# Patient Record
Sex: Female | Born: 2000 | Race: White | Hispanic: No | Marital: Single | Smoking: Never smoker
Health system: Southern US, Community
[De-identification: ages and names within clinical notes are randomized; demographics above are authoritative.]

## PROBLEM LIST (undated history)

## (undated) DIAGNOSIS — R519 Headache, unspecified: Secondary | ICD-10-CM

## (undated) DIAGNOSIS — H6690 Otitis media, unspecified, unspecified ear: Secondary | ICD-10-CM

## (undated) DIAGNOSIS — M419 Scoliosis, unspecified: Secondary | ICD-10-CM

## (undated) DIAGNOSIS — H532 Diplopia: Secondary | ICD-10-CM

## (undated) DIAGNOSIS — H9319 Tinnitus, unspecified ear: Secondary | ICD-10-CM

## (undated) HISTORY — DX: Tinnitus, unspecified ear: H93.19

## (undated) HISTORY — DX: Scoliosis, unspecified: M41.9

## (undated) HISTORY — DX: Diplopia: H53.2

## (undated) HISTORY — DX: Headache, unspecified: R51.9

## (undated) HISTORY — DX: Otitis media, unspecified, unspecified ear: H66.90

## (undated) HISTORY — PX: OTHER SURGICAL HISTORY: SHX169

---

## 2019-09-05 ENCOUNTER — Other Ambulatory Visit: Payer: Self-pay

## 2019-09-05 ENCOUNTER — Ambulatory Visit (INDEPENDENT_AMBULATORY_CARE_PROVIDER_SITE_OTHER): Payer: Medicaid Other | Admitting: Otolaryngology

## 2019-09-05 DIAGNOSIS — H66012 Acute suppurative otitis media with spontaneous rupture of ear drum, left ear: Secondary | ICD-10-CM | POA: Diagnosis not present

## 2019-09-19 ENCOUNTER — Other Ambulatory Visit: Payer: Self-pay

## 2019-09-19 ENCOUNTER — Ambulatory Visit (INDEPENDENT_AMBULATORY_CARE_PROVIDER_SITE_OTHER): Payer: Medicaid Other | Admitting: Otolaryngology

## 2019-11-10 ENCOUNTER — Other Ambulatory Visit (HOSPITAL_COMMUNITY): Payer: Self-pay | Admitting: Otolaryngology

## 2019-11-10 ENCOUNTER — Other Ambulatory Visit: Payer: Self-pay | Admitting: Otolaryngology

## 2019-11-10 DIAGNOSIS — H9012 Conductive hearing loss, unilateral, left ear, with unrestricted hearing on the contralateral side: Secondary | ICD-10-CM

## 2019-11-25 ENCOUNTER — Other Ambulatory Visit: Payer: Self-pay

## 2019-11-25 ENCOUNTER — Ambulatory Visit (HOSPITAL_COMMUNITY)
Admission: RE | Admit: 2019-11-25 | Discharge: 2019-11-25 | Disposition: A | Discharge status: Home / Self Care | Payer: Medicaid Other | Source: Ambulatory Visit | Attending: Otolaryngology | Admitting: Otolaryngology

## 2019-11-25 DIAGNOSIS — H9012 Conductive hearing loss, unilateral, left ear, with unrestricted hearing on the contralateral side: Secondary | ICD-10-CM | POA: Diagnosis not present

## 2020-10-16 ENCOUNTER — Ambulatory Visit (INDEPENDENT_AMBULATORY_CARE_PROVIDER_SITE_OTHER): Payer: Medicaid Other | Admitting: Neurology

## 2020-10-16 ENCOUNTER — Telehealth: Payer: Self-pay | Admitting: Neurology

## 2020-10-16 ENCOUNTER — Encounter: Payer: Self-pay | Admitting: Neurology

## 2020-10-16 ENCOUNTER — Ambulatory Visit: Payer: Self-pay

## 2020-10-16 VITALS — BP 133/93 | HR 87 | Ht 64.0 in | Wt 137.5 lb

## 2020-10-16 DIAGNOSIS — H532 Diplopia: Secondary | ICD-10-CM

## 2020-10-16 DIAGNOSIS — G932 Benign intracranial hypertension: Secondary | ICD-10-CM

## 2020-10-16 MED ORDER — GADOBENATE DIMEGLUMINE 529 MG/ML IV SOLN
12.0000 mL | Freq: Once | INTRAVENOUS | Status: AC | PRN
  Administered 2020-10-16: 18:00:00 12 mL via INTRAVENOUS

## 2020-10-16 NOTE — Progress Notes (Addendum)
Chief Complaint  Patient presents with  . New Patient (Initial Visit)    Referred for sudden onset of horizontal diplopia on 10/05/20. Prior to that to date, she was having daily headaches for two weeks which is not typical for her. Tinnitus present for two months. States she was just seen by ENT who is going to monitor the symptoms. Her vision has not improved. Headaches have resolved. She also mentions having a chronic ear infection that took 6+ months to clear.    HISTORICAL  Deanna Romero is a 19 year old female, seen in request by her optometrist Dr. Conley Rolls, [place] for evaluation of bilateral papillary edema, low vision, she is accompanied by her mother at today's clinical visit on October 16, 2020.  Her primary care physician is Dr. Neita Carp, Clarene Critchley, MD   I reviewed and summarized the referring note.PMhx. Acne, taking minocycline  Birth control, was switched to current [place] in August 2021, Chronic migraine headaches  She had long history of chronic migraine headaches, she describes her migraines as moderate to severe pain at bilateral frontal, vertex region pressure headaches, with light noise sensitivity, no nausea, usually last few hours, Maxalt as needed works well for her, she has been wearing glasses, was followed up by Dr.Le, [place] for few years  On October 05, 2020, she got different kind of headaches, holoacranial, constant pressure, failed to respond to Maxalt treatment, and multiple over-the-counter medications, Tylenol, ibuprofen, Excedrin Migraine and Fioricet, the headache last about a week  On October 12, 2020, the day of her graduation, she woke up noticed double vision looking to the left and right, horizontal, mild double vision looking for distance straight ahead, no double vision when looking close up, she described double vision, blurry when walking across the stage  Since then, she noticed intermittent double vision, no significant change over the past few days,  she denies droopy eyelid, no dysarthria, no dysphagia, no limb muscle weakness, she no longer has headache now  She was seen by Dr. 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  ALLERGIES: Allergies  Allergen Reactions  . Sulfamethoxazole Hives    HOME MEDICATIONS: Current Outpatient Medications  Medication Sig Dispense Refill  . aluminum chloride (DRYSOL) 20 % external solution Apply topically at bedtime.    Marland Kitchen aspirin-acetaminophen-caffeine (EXCEDRIN MIGRAINE) 250-250-65 MG tablet Take 1 tablet by mouth every 6 (six) hours as needed for headache. Takes once per week.    . butalbital-acetaminophen-caffeine (FIORICET) 50-325-40 MG tablet Take 1 tablet by mouth as needed for headache.    . clindamycin (CLEOCIN T) 1 % external solution Apply topically daily.    Marland Kitchen ibuprofen (ADVIL) 200 MG tablet Take 200 mg by mouth every 6 (six) hours as needed. Takes once per week.    . minocycline (MINOCIN) 100 MG capsule Take 1 capsule by mouth every other day.    . norethindrone (MICRONOR) 0.35 MG tablet Take 1 tablet by mouth daily.    . Rizatriptan Benzoate (MAXALT PO) Take by mouth as needed. Unsure of dosage.     No current facility-administered medications for this visit.    PAST MEDICAL HISTORY: Past Medical History:  Diagnosis Date  . Chronic ear infection   . Diplopia   .  Headache   . Scoliosis   . Tinnitus     PAST SURGICAL HISTORY: Past Surgical History:  Procedure Laterality Date  . No past surgery      FAMILY HISTORY: Family History  Problem Relation Age of Onset  . Migraines Mother   . Migraines Father     SOCIAL HISTORY: Social History   Socioeconomic History  . Marital status: Single     Spouse name: Not on file  . Number of children: 0  . Years of education: in college  . Highest education level: Not on file  Occupational History  . Occupation: Consulting civil engineer  Tobacco Use  . Smoking status: Never Smoker  . Smokeless tobacco: Never Used  Substance and Sexual Activity  . Alcohol use: Not Currently  . Drug use: Never  . Sexual activity: Not on file  Other Topics Concern  . Not on file  Social History Narrative   Lives at home.   Right-handed.   No daily use of caffeine.   Social Determinants of Health   Financial Resource Strain: Not on file  Food Insecurity: Not on file  Transportation Needs: Not on file  Physical Activity: Not on file  Stress: Not on file  Social Connections: Not on file  Intimate Partner Violence: Not on file     PHYSICAL EXAM   Vitals:   10/16/20 1533  BP: (!) 133/93  Pulse: 87  Weight: 137 lb 8 oz (62.4 kg)  Height: 5\' 4"  (1.626 m)   Not recorded     Body mass index is 23.6 kg/m.  PHYSICAL EXAMNIATION:  Gen: NAD, conversant, well nourised, well groomed                     Cardiovascular: Regular rate rhythm, no peripheral edema, warm, nontender. Eyes: Conjunctivae clear without exudates or hemorrhage Neck: Supple, no carotid bruits. Pulmonary: Clear to auscultation bilaterally   NEUROLOGICAL EXAM:  MENTAL STATUS: Speech:    Speech is normal; fluent and spontaneous with normal comprehension.  Cognition:     Orientation to time, place and person     Normal recent and remote memory     Normal Attention span and concentration     Normal Language, naming, repeating,spontaneous speech     Fund of knowledge   CRANIAL NERVES: CN II: Visual fields are full to confrontation. Pupils are round equal and briskly reactive to light. CN III, IV, VI: No ptosis, esotropia, Red lens testing confirmed bilateral lateral rectus muscle weakness CN V: Facial sensation is intact to light touch CN VII: Face is symmetric with normal eye  closure  CN VIII: Hearing is normal to causal conversation. CN IX, X: Phonation is normal. CN XI: Head turning and shoulder shrug are intact  MOTOR: There is no pronator drift of out-stretched arms. Muscle bulk and tone are normal. Muscle strength is normal.  REFLEXES: Reflexes are 2+ and symmetric at the biceps, triceps, knees, and ankles. Plantar responses are flexor.  SENSORY: Intact to light touch, pinprick and vibratory sensation are intact in fingers and toes.  COORDINATION: There is no trunk or limb dysmetria noted.  GAIT/STANCE: Posture is normal. Gait is steady with normal steps, base, arm swing, and turning. Heel and toe walking are normal. Tandem gait is normal.  Romberg is absent.   DIAGNOSTIC DATA (LABS, IMAGING, TESTING) - I reviewed patient records, labs, notes, testing and imaging myself where available.   ASSESSMENT AND PLAN  Deanna Romero is a 19 y.o. 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 Ph.D.  [facility] [address] [place], [place] [address] Ph: [phone] Fax: [phone]  CC:  Conley Rolls [place], [place] [address] [place],  [place] [address]  Estanislado Pandy, MD

## 2020-10-16 NOTE — Telephone Encounter (Signed)
Per Dr. Terrace Arabia, pt needs to be seen back in 2 weeks. She does not have any openings until February. Advised pt we would call her when we are able to work her in somewhere.

## 2020-10-16 NOTE — Telephone Encounter (Signed)
Per vo by Dr. Terrace Arabia, she will review her MRI brain then make a decision on when her appt should be scheduled. We can work her in where needed.

## 2020-10-17 ENCOUNTER — Telehealth: Payer: Self-pay | Admitting: Neurology

## 2020-10-17 DIAGNOSIS — G932 Benign intracranial hypertension: Secondary | ICD-10-CM | POA: Insufficient documentation

## 2020-10-17 LAB — CBC WITH DIFFERENTIAL
Basophils Absolute: 0.1 10*3/uL (ref 0.0–0.2)
Basos: 1 %
EOS (ABSOLUTE): 0.1 10*3/uL (ref 0.0–0.4)
Eos: 2 %
Hematocrit: 44.2 % (ref 34.0–46.6)
Hemoglobin: 15 g/dL (ref 11.1–15.9)
Immature Grans (Abs): 0 10*3/uL (ref 0.0–0.1)
Immature Granulocytes: 0 %
Lymphocytes Absolute: 2.7 10*3/uL (ref 0.7–3.1)
Lymphs: 30 %
MCH: 30 pg (ref 26.6–33.0)
MCHC: 33.9 g/dL (ref 31.5–35.7)
MCV: 88 fL (ref 79–97)
Monocytes Absolute: 0.6 10*3/uL (ref 0.1–0.9)
Monocytes: 7 %
Neutrophils Absolute: 5.4 10*3/uL (ref 1.4–7.0)
Neutrophils: 60 %
RBC: 5 x10E6/uL (ref 3.77–5.28)
RDW: 12.2 % (ref 11.7–15.4)
WBC: 8.9 10*3/uL (ref 3.4–10.8)

## 2020-10-17 LAB — THYROID PANEL WITH TSH
Free Thyroxine Index: 3.1 (ref 1.2–4.9)
T3 Uptake Ratio: 31 % (ref 24–39)
T4, Total: 9.9 ug/dL (ref 4.5–12.0)
TSH: 2.21 u[IU]/mL (ref 0.450–4.500)

## 2020-10-17 LAB — COMPREHENSIVE METABOLIC PANEL
ALT: 47 IU/L — ABNORMAL HIGH (ref 0–32)
AST: 26 IU/L (ref 0–40)
Albumin/Globulin Ratio: 1.5 (ref 1.2–2.2)
Albumin: 4.7 g/dL (ref 3.9–5.0)
Alkaline Phosphatase: 109 IU/L — ABNORMAL HIGH (ref 42–106)
BUN/Creatinine Ratio: 11 (ref 9–23)
BUN: 11 mg/dL (ref 6–20)
Bilirubin Total: 0.3 mg/dL (ref 0.0–1.2)
CO2: 23 mmol/L (ref 20–29)
Calcium: 10.3 mg/dL — ABNORMAL HIGH (ref 8.7–10.2)
Chloride: 103 mmol/L (ref 96–106)
Creatinine, Ser: 1.01 mg/dL — ABNORMAL HIGH (ref 0.57–1.00)
GFR calc Af Amer: 93 mL/min/{1.73_m2} (ref >59)
GFR calc non Af Amer: 80 mL/min/{1.73_m2} (ref >59)
Globulin, Total: 3.1 g/dL (ref 1.5–4.5)
Glucose: 98 mg/dL (ref 65–99)
Potassium: 4.2 mmol/L (ref 3.5–5.2)
Sodium: 141 mmol/L (ref 134–144)
Total Protein: 7.8 g/dL (ref 6.0–8.5)

## 2020-10-17 LAB — C-REACTIVE PROTEIN: CRP: <1 mg/L (ref 0–10)

## 2020-10-17 LAB — ANA W/REFLEX IF POSITIVE: Anti Nuclear Antibody (ANA): NEGATIVE

## 2020-10-17 LAB — SEDIMENTATION RATE: Sed Rate: 3 mm/hr (ref 0–32)

## 2020-10-17 LAB — HIV ANTIBODY (ROUTINE TESTING W REFLEX): HIV Screen 4th Generation wRfx: NONREACTIVE

## 2020-10-17 LAB — RPR: RPR Ser Ql: NONREACTIVE

## 2020-10-17 NOTE — Telephone Encounter (Signed)
  IMPRESSION: This MRI of the brain with and without contrast shows the following: 1.    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 2.    The brain is otherwise normal. 3.    No acute findings.  I have called patient, MRI brain w/wo only showed incidental findings of right cerebellar cavernous angioma 9 mm, would not explain her bilateral papillary edema, there was no other intracranial space-occupying lesion  1, I will refer her to ophthalmologist for further evaluation including formal visual field testing  2, fluoroscopy guided lumbar puncture  3, MRV of the brain

## 2020-10-17 NOTE — Addendum Note (Signed)
Addended by: Levert Feinstein on: 10/17/2020 08:15 AM   Modules accepted: Orders

## 2020-10-17 NOTE — Telephone Encounter (Signed)
mcd healthy blue pending  

## 2020-10-18 ENCOUNTER — Ambulatory Visit
Admission: RE | Admit: 2020-10-18 | Discharge: 2020-10-18 | Disposition: A | Discharge status: Home / Self Care | Payer: Medicaid Other | Source: Ambulatory Visit | Attending: Neurology | Admitting: Neurology

## 2020-10-18 VITALS — BP 128/87 | HR 70

## 2020-10-18 DIAGNOSIS — G932 Benign intracranial hypertension: Secondary | ICD-10-CM

## 2020-10-18 DIAGNOSIS — H532 Diplopia: Secondary | ICD-10-CM

## 2020-10-18 NOTE — Discharge Instructions (Signed)

## 2020-10-20 IMAGING — CT CT TEMPORAL BONES W/O CM
2 of 4 series · 13 of 40 positions shown, 16 images · non-contrast
Comparison: None.

CLINICAL DATA: Initial evaluation for chronic drainage from left
ear

EXAM:
CT TEMPORAL BONES WITHOUT CONTRAST
TECHNIQUE: Axial and coronal plane CT imaging of the petrous temporal bones was
performed with thin-collimation image reconstruction. No intravenous
contrast was administered. Multiplanar CT image reconstructions were
also generated.

[Series 5: ax mag right · axial · 0.19mm/px · z∈[+48,+118]mm · 10 of 139 slices shown, 13 images]
[im 11/139  brain]
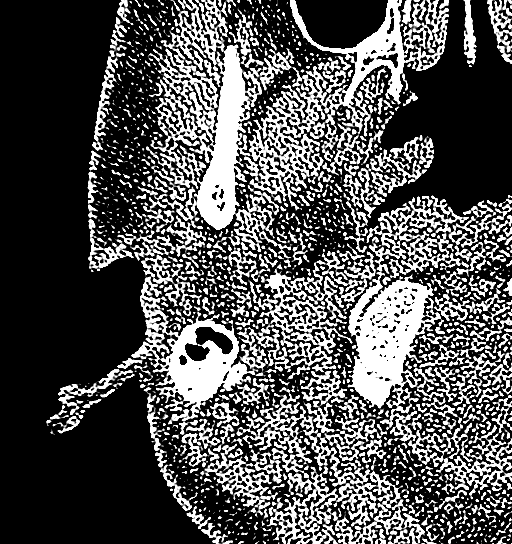
[im 11/139  bone]
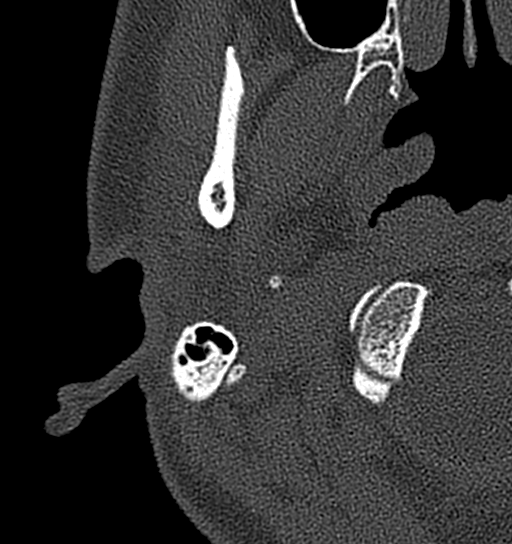
[im 22/139  bone]
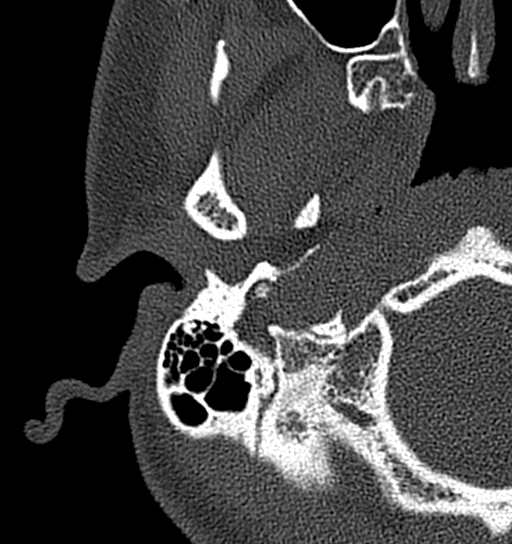
[im 43/139  bone]
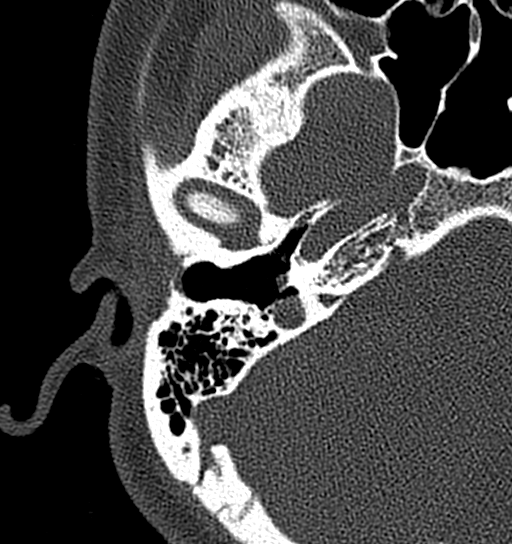
[im 54/139  bone]
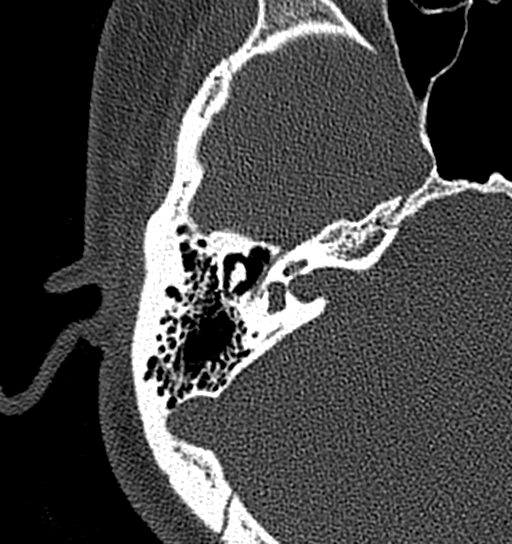
[im 64/139  brain]
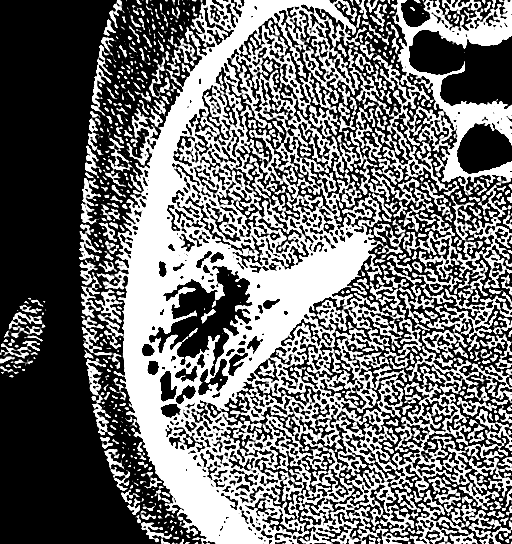
[im 64/139  bone]
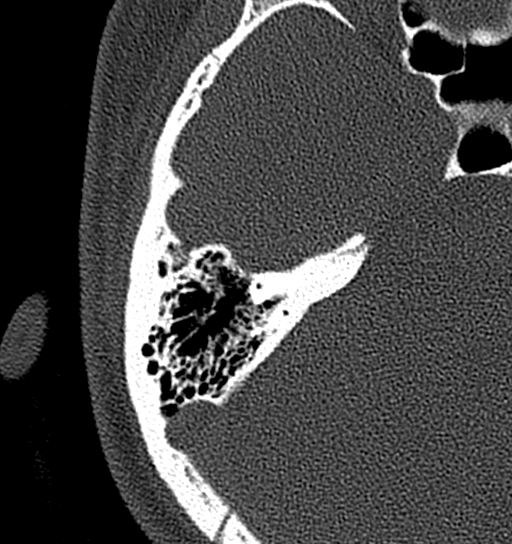
[im 75/139  bone]
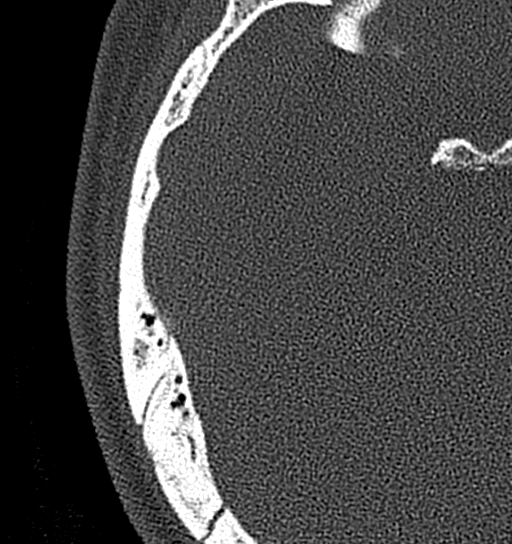
[im 85/139  bone]
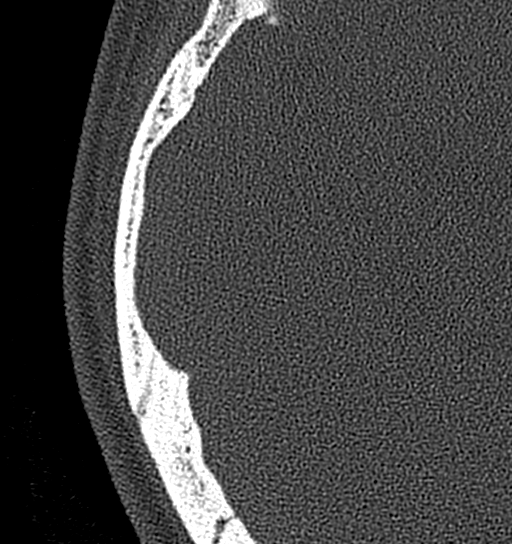
[im 107/139  bone]
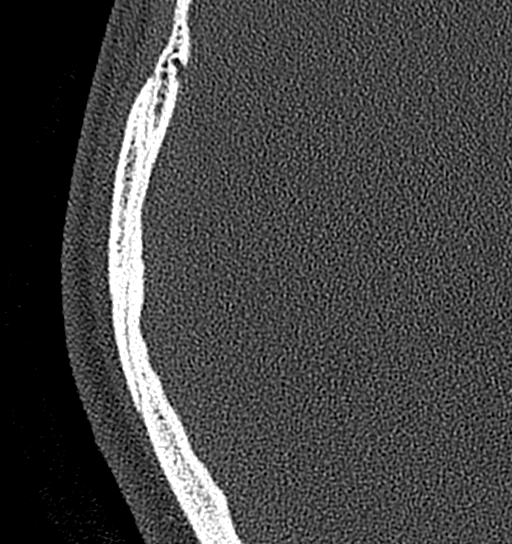
[im 117/139  brain]
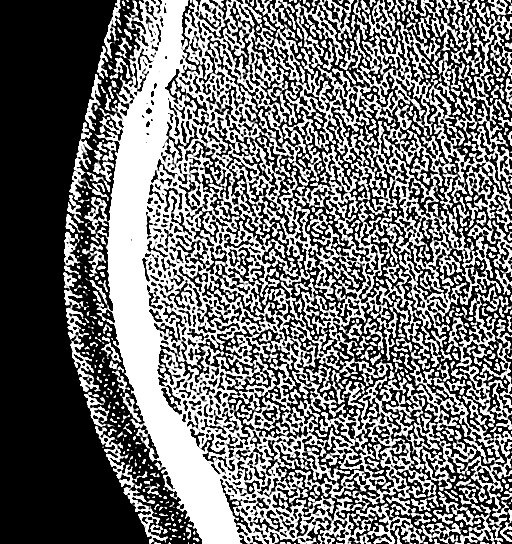
[im 117/139  bone]
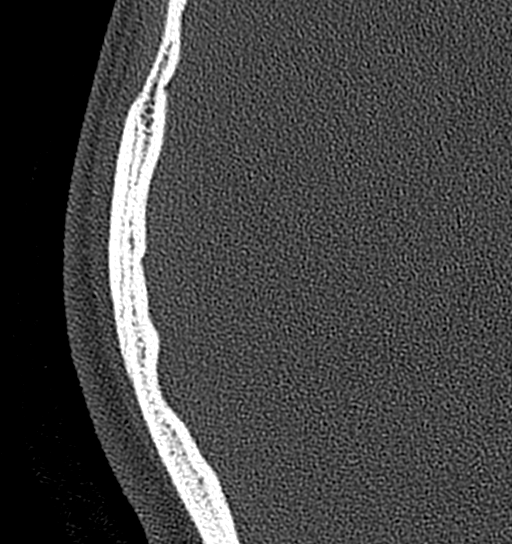
[im 128/139  bone]
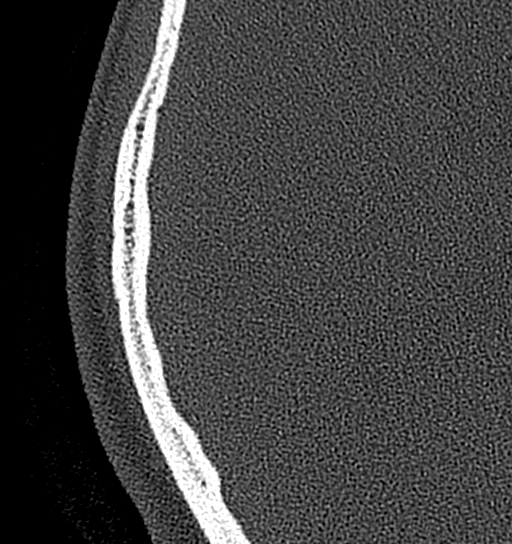

[Series 9: cor soft · coronal · 0.19mm/px · 3 of 95 slices shown]
[im 32/95  bone]
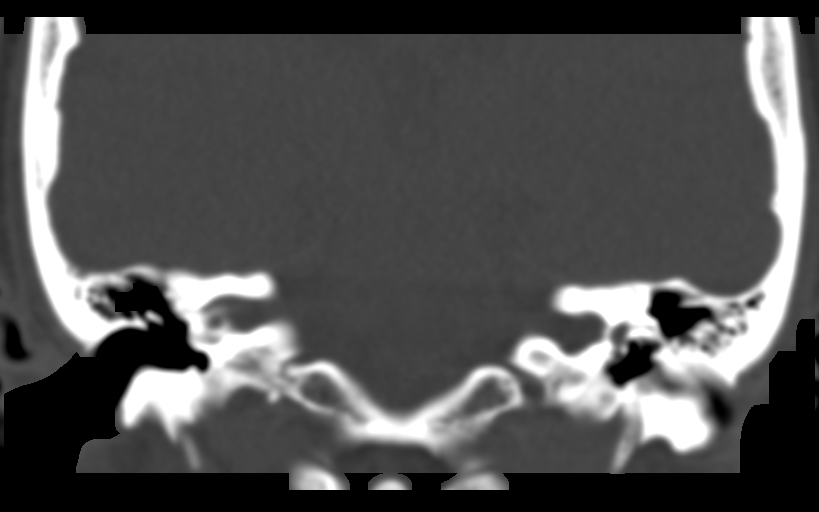
[im 42/95  bone]
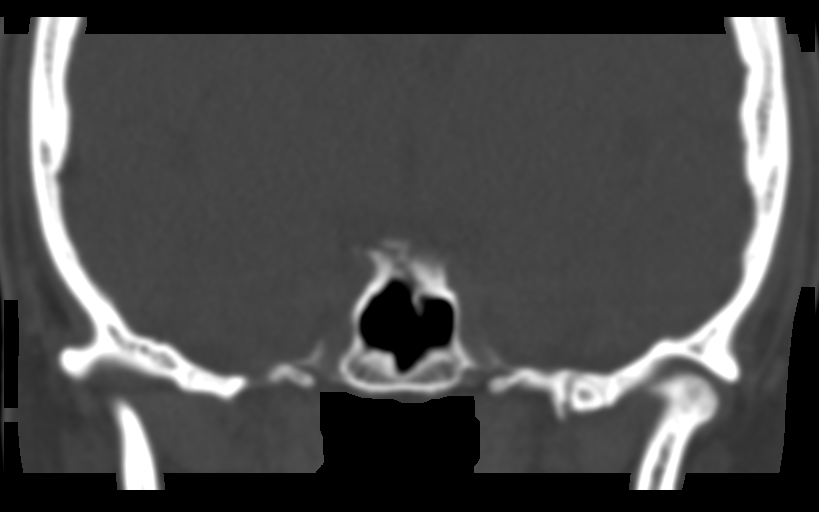
[im 53/95  bone]
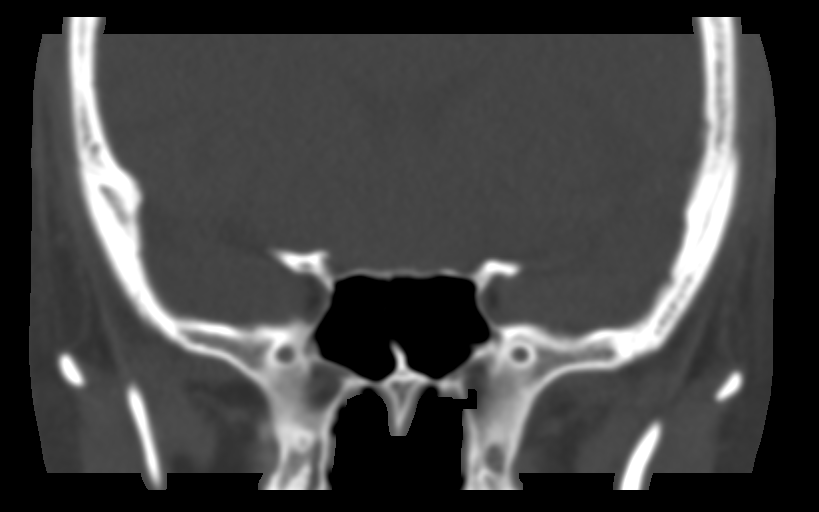

[13 of 40 positions shown; findings below may reference images not displayed]

FINDINGS: Left temporal bone:

Visualized auricle and pinna within normal limits. No pre or
postauricular soft tissue swelling or inflammatory changes. There is
abnormal circumferential soft tissue thickening involving the left
external auditory canal (series 8, image 103). Thinning and mild
erosive changes of the underlying tympanic plate. Tympanic membrane
is thickened and irregular but remains intact. No significant
erosion of the scutum. Middle ear cavity including the epitympanum,
mesotympanum, and hypotympanum are clear. Ossicular chain normally
formed and intact. Tegmen tympani intact. Mastoid air cells are
largely clear. No coalescence. Sigmoid plate intact. Inner ear
structures including the vestibule, cochlea, and semi circular
canals within normal limits. Facial nerve canal remains bony covered
and intact, with the facial nerves seen coursing normally out the
stylomastoid foramen. Internal auditory canal within normal limits.
Normal vestibular aqueduct. Jugular bulb and carotid canal intact
and normal.

Right temporal bone:

Visualized auricle and pinna within normal limits. External auditory
canal is clear. Tympanic membrane is thin. Middle ear cavity is
clear. Ossicular chain intact and normal. Tegmen tympani intact.
Mastoid air cells are clear. No coalescence. Sigmoid plate intact.
Inner ear structures including the vestibule, cochlea, and semi
circular canals within normal limits. Facial nerve canal bony
covered and intact, with the facial nerves seen coursing normally at
the stylomastoid foramen. Jugular bulb and carotid canals intact and
normal.

Visualized intracranial contents within normal limits. Partially
visualized globes and orbital soft tissues unremarkable. Visualized
paranasal sinuses are clear.
IMPRESSION: 1. Circumferential soft tissue thickening about the left external
auditory canal, with thickening and irregularity at the left
tympanic membrane, with mild thinning and osseous erosion of the
underlying left tympanic plate. Changes are nonspecific, and could
reflect the sequelae of chronic otitis externa with subsequent post
inflammatory changes. EAC cholesteatoma could also conceivably have
this appearance.
2. Otherwise normal left temporal bone CT.
3. Normal right temporal bone CT.

## 2020-10-22 LAB — PROTEIN, CSF: Total Protein, CSF: 30 mg/dL (ref 15–45)

## 2020-10-22 LAB — CSF CULTURE W GRAM STAIN
MICRO NUMBER:: 11353158
Result:: NO GROWTH
SPECIMEN QUALITY:: ADEQUATE

## 2020-10-22 LAB — CSF CELL COUNT WITH DIFFERENTIAL
RBC Count, CSF: 91 cells/uL — ABNORMAL HIGH
WBC, CSF: 3 cells/uL (ref 0–5)

## 2020-10-22 LAB — GLUCOSE, CSF: Glucose, CSF: 51 mg/dL (ref 40–80)

## 2020-10-22 NOTE — Telephone Encounter (Signed)
Checked Availity Portal. Still Pending.

## 2020-10-24 ENCOUNTER — Telehealth: Payer: Self-pay | Admitting: Neurology

## 2020-10-24 NOTE — Telephone Encounter (Signed)
I returned the patient's call and gave her results of spinal tap which showed normal except slight traumatic tap and all available lab results so far are negative only acetylcholine receptor antibodies are pending.  MRI scan of the brain is pretty unremarkable except for incidental small right cerebellar venous angioma/cavernoma which is an incidental finding.  She interestingly stated that after the spinal tap all her symptoms had resolved.  She was asked to call back next week to discuss further plan with Dr. Terrace Arabia.  MRV of the brain has been ordered but appear has not been scheduled yet

## 2020-10-24 NOTE — Telephone Encounter (Signed)
Pt called stating she wanted to discuss the results of her spinal tap and also had a question about a couple referrals she is waiting on. Best call back is [phone]

## 2020-10-29 NOTE — Telephone Encounter (Signed)
Checked the status it is still pending  

## 2020-10-31 NOTE — Telephone Encounter (Signed)
mcd healthy blue Berkley Harvey: [id number] (exp. 10/17/20 to 12/16/20) order sent to GI. They will reach out to the patient to schedule.

## 2020-11-05 NOTE — Telephone Encounter (Signed)
Fluoroscopy guided lumbar puncture October 18, 2020, opening pressure was 28 cmH2O  On scheduled for MRV for November 09, 2020  Please get her record of most recent ophthalmology evaluation (referral was put in October 17, 2020)

## 2020-11-05 NOTE — Telephone Encounter (Signed)
Please move up her appointment with Dr. Dione Booze before Nov 20 2020 follow up visit.

## 2020-11-05 NOTE — Telephone Encounter (Signed)
I called Dr Laruth Bouchard office to obtain copy of office note. I was told the patient has not seen him yet and is scheduled for November 27, 2020.

## 2020-11-06 NOTE — Telephone Encounter (Signed)
I called Dr Laruth Bouchard office and LVM for Amy asking for the pt's appt with Dr Dione Booze to be moved from 2/1 to prior to 1/25 which is the day the patient sees Dr Terrace Arabia in the office for follow-up of the work-up. I let them know this request was by Dr Terrace Arabia. I left the office number in the message and requested a call back.

## 2020-11-06 NOTE — Telephone Encounter (Signed)
[name] from Dr. Laruth Bouchard office called wanting to inform RN that the pt has been r/s sooner. She also states that they are needing notes faxed over to them for the pt's appt. Please advise.

## 2020-11-06 NOTE — Telephone Encounter (Signed)
Notes faxed to Dr. Laruth Bouchard office

## 2020-11-09 ENCOUNTER — Ambulatory Visit
Admission: RE | Admit: 2020-11-09 | Discharge: 2020-11-09 | Disposition: A | Discharge status: Home / Self Care | Payer: Medicaid Other | Source: Ambulatory Visit | Attending: Neurology | Admitting: Neurology

## 2020-11-09 ENCOUNTER — Other Ambulatory Visit: Payer: Self-pay

## 2020-11-09 DIAGNOSIS — G932 Benign intracranial hypertension: Secondary | ICD-10-CM

## 2020-11-20 ENCOUNTER — Encounter: Payer: Self-pay | Admitting: Neurology

## 2020-11-20 ENCOUNTER — Ambulatory Visit: Payer: Medicaid Other | Admitting: Neurology

## 2020-11-20 VITALS — BP 126/82 | HR 80 | Ht 64.0 in | Wt 139.5 lb

## 2020-11-20 DIAGNOSIS — G932 Benign intracranial hypertension: Secondary | ICD-10-CM | POA: Diagnosis not present

## 2020-11-20 DIAGNOSIS — H532 Diplopia: Secondary | ICD-10-CM | POA: Diagnosis not present

## 2020-11-20 DIAGNOSIS — G43709 Chronic migraine without aura, not intractable, without status migrainosus: Secondary | ICD-10-CM

## 2020-11-20 NOTE — Progress Notes (Addendum)
Chief Complaint  Patient presents with   Follow-up    Binocular vision disorder with diplopia. She is here with her mother, Shirlean Mylar. They would like to discuss her MRI brain, MRV head, LP and ophthalmology evaluation. Her diplopia improved after having the LP.    HISTORICAL  Deanna Romero is a 20 year old female, seen in request by her optometrist Dr. Marin Comment, [place] for evaluation of bilateral papillary edema, low vision, she is accompanied by her mother at today's clinical visit on October 16, 2020.  Her primary care physician is Dr. Quintin Alto, Silvestre Moment, MD   I reviewed and summarized the referring note.PMhx. Acne, taking minocycline  Birth control, was switched to current [place] in August 2021, Chronic migraine headaches  She had long history of chronic migraine headaches, she describes her migraines as moderate to severe pain at bilateral frontal, vertex region pressure headaches, with light noise sensitivity, no nausea, usually last few hours, Maxalt as needed works well for her, she has been wearing glasses, was followed up by Dr.Le, [place] for few years  On October 05, 2020, she got different kind of headaches, holoacranial, constant pressure, failed to respond to Maxalt treatment, and multiple over-the-counter medications, Tylenol, ibuprofen, Excedrin Migraine and Fioricet, the headache last about a week  On October 12, 2020, the day of her graduation, she woke up noticed double vision looking to the left and right, horizontal, mild double vision looking for distance straight ahead, no double vision when looking close up, she described double vision, blurry when walking across the stage  Since then, she noticed intermittent double vision, no significant change over the past few days, she denies droopy eyelid, no dysarthria, no dysphagia, no limb muscle weakness, she no longer has headache now  She was seen by Dr. Marin Comment on December 17, who described severe bilateral optic disc edema, with  hemorrhagic component, enlarged blind spots in both eyes, horizontal diplopia, she also mentioned vertical diplopia  Update November 20, 2020: She is accompanied by her mother at today's clinical visit.  She had a fluoroscopy guided lumbar puncture on October 18, 2020, opening pressure was 28 cmH2O, since the lumbar puncture, her symptoms has much improved, 2 days after lumbar puncture, her double vision and headache disappeared  She was seen by ophthalmologist Dr. Carolynn Sayers on November 15, 2020, no evidence of disc edema, no disc pallor, CDR 0.3 on the right eye, 0.35 on the left, describes skew deviation, exophoria bilaterally  We personally reviewed MRI of the brain on October 16, 2020, incidental findings of 9 mm right cerebellum cavernous angioma, otherwise no significant abnormality  MRV in January 2022 showed no significant abnormality  Spinal fluid testing was normal, total protein of 30, WBC of 3, RBC of 91  Extensive laboratory evaluation showed normal negative ANA, CMP, CBC, ESR, C-reactive protein, thyroid functional test, HIV, RPR, acetylcholine receptor antibody  REVIEW OF SYSTEMS: Full 14 system review of systems performed and notable only for as above All other review of systems were negative.  ALLERGIES: Allergies  Allergen Reactions   Sulfamethoxazole Hives    HOME MEDICATIONS: Current Outpatient Medications  Medication Sig Dispense Refill   aluminum chloride (DRYSOL) 20 % external solution Apply topically at bedtime.     aspirin-acetaminophen-caffeine (EXCEDRIN MIGRAINE) 250-250-65 MG tablet Take 1 tablet by mouth every 6 (six) hours as needed for headache. Takes once per week.     butalbital-acetaminophen-caffeine (FIORICET) 50-325-40 MG tablet Take 1 tablet by mouth as needed for headache.  clindamycin (CLEOCIN T) 1 % external solution Apply topically daily.     ibuprofen (ADVIL) 200 MG tablet Take 200 mg by mouth every 6 (six) hours as needed. Takes once per week.      minocycline (MINOCIN) 100 MG capsule Take 1 capsule by mouth every other day.     norethindrone (MICRONOR) 0.35 MG tablet Take 1 tablet by mouth daily.     Rizatriptan Benzoate (MAXALT PO) Take by mouth as needed. Unsure of dosage.     No current facility-administered medications for this visit.    PAST MEDICAL HISTORY: Past Medical History:  Diagnosis Date   Chronic ear infection    Diplopia    Headache    Scoliosis    Tinnitus     PAST SURGICAL HISTORY: Past Surgical History:  Procedure Laterality Date   No past surgery      FAMILY HISTORY: Family History  Problem Relation Age of Onset   Migraines Mother    Migraines Father     SOCIAL HISTORY: Social History   Socioeconomic History   Marital status: Single    Spouse name: Not on file   Number of children: 0   Years of education: in college   Highest education level: Not on file  Occupational History   Occupation: Ship broker  Tobacco Use   Smoking status: Never Smoker   Smokeless tobacco: Never Used  Substance and Sexual Activity   Alcohol use: Not Currently   Drug use: Never   Sexual activity: Not on file  Other Topics Concern   Not on file  Social History Narrative   Lives at home.   Right-handed.   No daily use of caffeine.   Social Determinants of Health   Financial Resource Strain: Not on file  Food Insecurity: Not on file  Transportation Needs: Not on file  Physical Activity: Not on file  Stress: Not on file  Social Connections: Not on file  Intimate Partner Violence: Not on file     PHYSICAL EXAM   Vitals:   11/20/20 1037  BP: 126/82  Pulse: 80  Weight: 139 lb 8 oz (63.3 kg)  Height: 5' 4"  (1.626 m)   Not recorded     Body mass index is 23.95 kg/m.  PHYSICAL EXAMNIATION:  Gen: NAD, conversant, well nourised, well groomed                     Cardiovascular: Regular rate rhythm, no peripheral edema, warm, nontender. Eyes: Conjunctivae clear without exudates or  hemorrhage Neck: Supple, no carotid bruits. Pulmonary: Clear to auscultation bilaterally  Muscular skeleton: Significant thoracic scoliosis  NEUROLOGICAL EXAM:  MENTAL STATUS: Speech/cognition: Awake, alert, oriented to history taking care of conversation   CRANIAL NERVES: CN II: Visual fields are full to confrontation. Pupils are round equal and briskly reactive to light. CN III, IV, VI: No ptosis, esotropia, Red lens testing confirmed bilateral lateral rectus muscle weakness CN V: Facial sensation is intact to light touch CN VII: Face is symmetric with normal eye closure  CN VIII: Hearing is normal to causal conversation. CN IX, X: Phonation is normal. CN XI: Head turning and shoulder shrug are intact  MOTOR: There is no pronator drift of out-stretched arms. Muscle bulk and tone are normal. Muscle strength is normal.  REFLEXES: Reflexes are 2+ and symmetric at the biceps, triceps, knees, and ankles. Plantar responses are flexor.  SENSORY: Intact to light touch, pinprick and vibratory sensation are intact in fingers and toes.  COORDINATION: There is no trunk or limb dysmetria noted.  GAIT/STANCE: Posture is normal. Gait is steady with normal steps, base, arm swing, and turning. Heel and toe walking are normal. Tandem gait is normal.  Romberg is absent.   DIAGNOSTIC DATA (LABS, IMAGING, TESTING) - I reviewed patient records, labs, notes, testing and imaging myself where available.   ASSESSMENT AND PLAN  Deanna Romero is a 20 y.o. 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 Carolynn Sayers on November 15, 2020 was normal, no evidence of papillary edema  Laboratory evaluation showed no treatable etiology  MRI of the brain, incidental findings of 8 mm right cerebellum  cavernous angioma, otherwise no significant abnormalities  MRV of the brain was normal  She denies significant headaches, advised her importance of continue follow-up with her ophthalmologist, triptan as needed for migraine headaches,    Marcial Pacas, M.D. Ph.D.  [facility] [address] [place], [place] [address] Ph: [phone] Fax: [phone]  CC:  Marin Comment [facility] [place], [place] [address] [place],  [place] [address]  Manon Hilding, MD   Ophthalmology evaluation by Dr. Nathanial Rancher December 26, 2020, was referred to Dr. Hassell Done per note, OCT done, no nerve edema, there appears to be new RNFL thinning OU, which is probably normal for her, but there was edema before, but to be sure nothing is actually changing, we will repeat visual field, OCT in 3 months, consider monocycline is the likely cause of increased intracranial hypertension  Addendum: Dr. Carolynn Sayers evaluation on March 28, 2021, repeat visual field, OCT stable, will repeat in 12 months, minocycline can potentially contributed to pseudotumor cerebri,

## 2021-05-20 ENCOUNTER — Other Ambulatory Visit (INDEPENDENT_AMBULATORY_CARE_PROVIDER_SITE_OTHER): Payer: Self-pay | Admitting: Internal Medicine

## 2021-06-12 ENCOUNTER — Ambulatory Visit (INDEPENDENT_AMBULATORY_CARE_PROVIDER_SITE_OTHER): Payer: Medicaid Other | Admitting: Nurse Practitioner

## 2021-08-05 ENCOUNTER — Encounter: Payer: Self-pay | Admitting: Internal Medicine

## 2021-08-05 ENCOUNTER — Ambulatory Visit (INDEPENDENT_AMBULATORY_CARE_PROVIDER_SITE_OTHER): Payer: Medicaid Other | Admitting: Internal Medicine

## 2021-08-05 ENCOUNTER — Other Ambulatory Visit: Payer: Self-pay

## 2021-08-05 VITALS — BP 122/84 | HR 99 | Temp 98.2°F | Resp 18 | Ht 65.0 in | Wt 152.1 lb

## 2021-08-05 DIAGNOSIS — Z7689 Persons encountering health services in other specified circumstances: Secondary | ICD-10-CM

## 2021-08-05 DIAGNOSIS — G932 Benign intracranial hypertension: Secondary | ICD-10-CM | POA: Diagnosis not present

## 2021-08-05 DIAGNOSIS — G43709 Chronic migraine without aura, not intractable, without status migrainosus: Secondary | ICD-10-CM | POA: Diagnosis not present

## 2021-08-05 DIAGNOSIS — E559 Vitamin D deficiency, unspecified: Secondary | ICD-10-CM

## 2021-08-05 DIAGNOSIS — M4125 Other idiopathic scoliosis, thoracolumbar region: Secondary | ICD-10-CM

## 2021-08-05 DIAGNOSIS — Z1159 Encounter for screening for other viral diseases: Secondary | ICD-10-CM

## 2021-08-05 DIAGNOSIS — H6062 Unspecified chronic otitis externa, left ear: Secondary | ICD-10-CM

## 2021-08-05 MED ORDER — RIZATRIPTAN BENZOATE 10 MG PO TABS: 10.0000 mg | ORAL_TABLET | ORAL | 1 refills | Status: DC | PRN

## 2021-08-05 NOTE — Assessment & Plan Note (Signed)
X-ray from 2021 reviewed from care everywhere She has seen [facility] orthopedic surgery in the past No active treatment currently Denies any acute back pain Monitor for now Will check x-ray if she has any new back pain

## 2021-08-05 NOTE — Assessment & Plan Note (Signed)
Controlled with Maxalt as needed 

## 2021-08-05 NOTE — Progress Notes (Signed)
New Patient Office Visit  Subjective:  Patient ID: Deanna Romero, female    DOB: 29-Mar-2001  Age: 20 y.o. MRN: [id number]  CC:  Chief Complaint  Patient presents with   New Patient (Initial Visit)    New patient was seeing dr sasser at [place] just establishing care     HPI Deanna Romero is a 20 year old female with PMH of migraine, chronic otitis externa and thoracolumbar scoliosis who presents for establishing care.  She is a former patient of Dr. Quintin Alto at [facility].  She has h/o migraine, for which she takes Maxalt. She needs it rarely.  She denies any acute headache, nausea or vomiting currently.  She had an episode of intracranial hypertension, likely deemed to be due to minocycline and OCPs.  It improved after a lumbar puncture.  She had double vision at that time, which has improved since.  She denies any vision difficulty currently.  She has seen [facility] for chronic otitis externa and uses Drysol.  She denies any ear discharge or pain currently.  She has history of thoracolumbar scoliosis, for which she has seen [facility] orthopedic surgeon in the past.  She denies any acute back pain currently.  Denies any numbness, tingling or weakness of the LE.  She is up to date with COVID-vaccine.  She has received flu vaccine as well.  Past Medical History:  Diagnosis Date   Chronic ear infection    Diplopia    Headache    Scoliosis    Tinnitus     Past Surgical History:  Procedure Laterality Date   No past surgery      Family History  Problem Relation Age of Onset   Migraines Mother    Migraines Father     Social History   Socioeconomic History   Marital status: Single    Spouse name: Not on file   Number of children: 0   Years of education: in college   Highest education level: Not on file  Occupational History   Occupation: Student  Tobacco Use   Smoking status: Never   Smokeless tobacco: Never  Substance and Sexual Activity   Alcohol use: Not Currently   Drug  use: Never   Sexual activity: Not on file  Other Topics Concern   Not on file  Social History Narrative   Lives at home.   Right-handed.   No daily use of caffeine.   Social Determinants of Health   Financial Resource Strain: Not on file  Food Insecurity: Not on file  Transportation Needs: Not on file  Physical Activity: Not on file  Stress: Not on file  Social Connections: Not on file  Intimate Partner Violence: Not on file    ROS Review of Systems  Constitutional:  Negative for chills and fever.  HENT:  Negative for congestion, sinus pressure, sinus pain and sore throat.   Eyes:  Negative for pain and discharge.  Respiratory:  Negative for cough and shortness of breath.   Cardiovascular:  Negative for chest pain and palpitations.  Gastrointestinal:  Negative for abdominal pain, constipation, diarrhea, nausea and vomiting.  Endocrine: Negative for polydipsia and polyuria.  Genitourinary:  Negative for dysuria and hematuria.  Musculoskeletal:  Negative for neck pain and neck stiffness.  Skin:  Negative for rash.  Neurological:  Negative for dizziness and weakness.  Psychiatric/Behavioral:  Negative for agitation and behavioral problems.    Objective:   Today's Vitals: BP 122/84 (BP Location: Left Arm, Cuff Size: Normal)  Pulse 99   Temp 98.2 F (36.8 C) (Oral)   Resp 18   Ht 5' 5"  (1.651 m)   Wt 152 lb 1.9 oz (69 kg)   SpO2 99%   BMI 25.31 kg/m   Physical Exam Vitals reviewed.  Constitutional:      General: She is not in acute distress.    Appearance: She is not diaphoretic.  HENT:     Head: Normocephalic and atraumatic.     Nose: Nose normal.     Mouth/Throat:     Mouth: Mucous membranes are moist.  Eyes:     General: No scleral icterus.    Extraocular Movements: Extraocular movements intact.  Cardiovascular:     Rate and Rhythm: Normal rate and regular rhythm.     Pulses: Normal pulses.     Heart sounds: Normal heart sounds. No murmur  heard. Pulmonary:     Breath sounds: Normal breath sounds. No wheezing or rales.  Musculoskeletal:     Cervical back: Neck supple. No tenderness.     Right lower leg: No edema.     Left lower leg: No edema.  Skin:    General: Skin is warm.     Findings: No rash.  Neurological:     General: No focal deficit present.     Mental Status: She is alert and oriented to person, place, and time.  Psychiatric:        Mood and Affect: Mood normal.        Behavior: Behavior normal.    Assessment & Plan:   Problem List Items Addressed This Visit       Cardiovascular and Mediastinum   Chronic migraine without aura without status migrainosus, not intractable    Controlled with Maxalt as needed      Relevant Medications   rizatriptan (MAXALT) 10 MG tablet     Nervous and Auditory   Idiopathic intracranial hypertension    Had episodes of headache, improved after lumbar puncture Followed by neurology -was deemed to be due to minocycline and OCPs      Chronic otitis externa of left ear    Followed by [facility] ENT        Musculoskeletal and Integument   Other idiopathic scoliosis, thoracolumbar region    X-ray from 2021 reviewed from care everywhere She has seen [facility] orthopedic surgery in the past No active treatment currently Denies any acute back pain Monitor for now Will check x-ray if she has any new back pain        Other   Encounter to establish care - Primary    Care established History and medications reviewed with the patient      Relevant Orders   CBC with Differential/Platelet   CMP14+EGFR   Lipid panel   TSH   Other Visit Diagnoses     Vitamin D deficiency       Relevant Orders   Vitamin D (25 hydroxy)   Need for hepatitis C screening test       Relevant Orders   Hepatitis C Antibody       Outpatient Encounter Medications as of 08/05/2021  Medication Sig   aluminum chloride (DRYSOL) 20 % external solution Apply topically at bedtime.   clindamycin  (CLEOCIN T) 1 % external solution Apply topically daily.   ibuprofen (ADVIL) 200 MG tablet Take 200 mg by mouth every 6 (six) hours as needed. Takes once per week.   rizatriptan (MAXALT) 10 MG tablet Take 1 tablet (10 mg  total) by mouth as needed for migraine. Repeat after 2 hours for persistent migraine headache. Maximum 2 tablets/day.   [DISCONTINUED] aspirin-acetaminophen-caffeine (EXCEDRIN MIGRAINE) 250-250-65 MG tablet Take 1 tablet by mouth every 6 (six) hours as needed for headache. Takes once per week.   [DISCONTINUED] butalbital-acetaminophen-caffeine (FIORICET) 50-325-40 MG tablet Take 1 tablet by mouth as needed for headache.   [DISCONTINUED] Rizatriptan Benzoate (MAXALT PO) Take by mouth as needed. Unsure of dosage.   [DISCONTINUED] minocycline (MINOCIN) 100 MG capsule Take 1 capsule by mouth every other day.   [DISCONTINUED] norethindrone (MICRONOR) 0.35 MG tablet Take 1 tablet by mouth daily.   No facility-administered encounter medications on file as of 08/05/2021.    Follow-up: Return in about 6 months (around 02/03/2022) for Annual physical.   Lindell Spar, MD

## 2021-08-05 NOTE — Assessment & Plan Note (Signed)
Had episodes of headache, improved after lumbar puncture Followed by neurology -was deemed to be due to minocycline and OCPs

## 2021-08-05 NOTE — Assessment & Plan Note (Signed)
Followed by [facility] ENT

## 2021-08-05 NOTE — Patient Instructions (Signed)
Please take Maxalt as needed for migraine as prescribed.

## 2021-08-05 NOTE — Assessment & Plan Note (Signed)
Care established History and medications reviewed with the patient 

## 2021-08-11 LAB — CMP14+EGFR
ALT: 20 IU/L (ref 0–32)
AST: 21 IU/L (ref 0–40)
Albumin/Globulin Ratio: 1.7 (ref 1.2–2.2)
Albumin: 4.6 g/dL (ref 3.9–5.0)
Alkaline Phosphatase: 78 IU/L (ref 44–121)
BUN/Creatinine Ratio: 13 (ref 9–23)
BUN: 11 mg/dL (ref 6–20)
Bilirubin Total: 0.5 mg/dL (ref 0.0–1.2)
CO2: 17 mmol/L — ABNORMAL LOW (ref 20–29)
Calcium: 9.5 mg/dL (ref 8.7–10.2)
Chloride: 108 mmol/L — ABNORMAL HIGH (ref 96–106)
Creatinine, Ser: 0.84 mg/dL (ref 0.57–1.00)
Globulin, Total: 2.7 g/dL (ref 1.5–4.5)
Glucose: 97 mg/dL (ref 70–99)
Potassium: 5.1 mmol/L (ref 3.5–5.2)
Sodium: 148 mmol/L — ABNORMAL HIGH (ref 134–144)
Total Protein: 7.3 g/dL (ref 6.0–8.5)
eGFR: 101 mL/min/{1.73_m2} (ref >59)

## 2021-08-11 LAB — CBC WITH DIFFERENTIAL/PLATELET
Basophils Absolute: 0.1 10*3/uL (ref 0.0–0.2)
Basos: 1 %
EOS (ABSOLUTE): 0.2 10*3/uL (ref 0.0–0.4)
Eos: 4 %
Hematocrit: 39.1 % (ref 34.0–46.6)
Hemoglobin: 13.3 g/dL (ref 11.1–15.9)
Immature Grans (Abs): 0 10*3/uL (ref 0.0–0.1)
Immature Granulocytes: 0 %
Lymphocytes Absolute: 1.8 10*3/uL (ref 0.7–3.1)
Lymphs: 29 %
MCH: 30.5 pg (ref 26.6–33.0)
MCHC: 34 g/dL (ref 31.5–35.7)
MCV: 90 fL (ref 79–97)
Monocytes Absolute: 0.4 10*3/uL (ref 0.1–0.9)
Monocytes: 7 %
Neutrophils Absolute: 3.5 10*3/uL (ref 1.4–7.0)
Neutrophils: 59 %
Platelets: 382 10*3/uL (ref 150–450)
RBC: 4.36 x10E6/uL (ref 3.77–5.28)
RDW: 12.3 % (ref 11.7–15.4)
WBC: 6 10*3/uL (ref 3.4–10.8)

## 2021-08-11 LAB — LIPID PANEL
Chol/HDL Ratio: 3 ratio (ref 0.0–4.4)
Cholesterol, Total: 186 mg/dL (ref 100–199)
HDL: 61 mg/dL (ref >39)
LDL Chol Calc (NIH): 113 mg/dL — ABNORMAL HIGH (ref 0–99)
Triglycerides: 66 mg/dL (ref 0–149)
VLDL Cholesterol Cal: 12 mg/dL (ref 5–40)

## 2021-08-11 LAB — VITAMIN D 25 HYDROXY (VIT D DEFICIENCY, FRACTURES): Vit D, 25-Hydroxy: 27.5 ng/mL — ABNORMAL LOW (ref 30.0–100.0)

## 2021-08-11 LAB — HEPATITIS C ANTIBODY: Hep C Virus Ab: <0.1 s/co ratio (ref 0.0–0.9)

## 2021-08-11 LAB — TSH: TSH: 1.74 u[IU]/mL (ref 0.450–4.500)

## 2021-09-13 IMAGING — XA DG SPINAL PUNCT LUMBAR DIAG WITH FL CT GUIDANCE
1 series · 1 of 1 positions shown · non-contrast
Comparison: none

CLINICAL DATA: Double vision.

[Series 1: ortho standard · 1 of 1 slices shown]
[im 1/1]
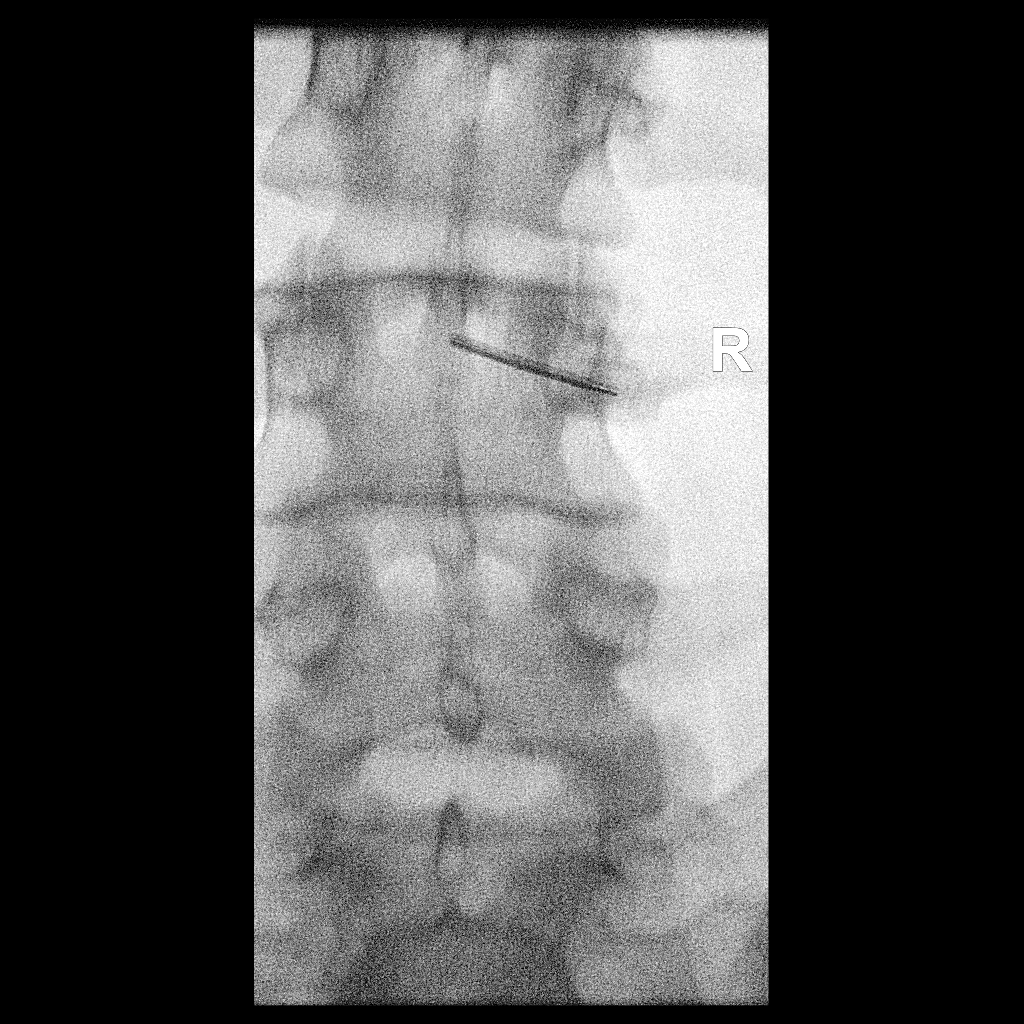

[1 of 1 positions shown; findings below may reference images not displayed]

EXAM:
DIAGNOSTIC LUMBAR PUNCTURE UNDER FLUOROSCOPIC GUIDANCE

FLUOROSCOPY TIME:  Fluoroscopy Time:  18 seconds

Radiation Exposure Index (if provided by the fluoroscopic device):
1.5 mGy

Number of Acquired Spot Images: 0

PROCEDURE:
Informed consent was obtained from the patient prior to the
procedure, including potential complications of headache, allergy,
and pain. With the patient prone, the lower back was prepped with
Betadine. 1% Lidocaine was used for local anesthesia. Lumbar
puncture was performed at the L3-L4 level via a right interlaminar
approach using a 3.5 inch 20 gauge needle with return of clear,
colorless CSF with an opening pressure of 28 cm water. 13 ml of CSF
were obtained for laboratory studies. Closing pressure was 20 cm
water. The patient tolerated the procedure well and there were no
apparent complications.
IMPRESSION: 1. Technically successful fluoroscopically guided lumbar puncture.
2. Elevated opening CSF pressure consistent with idiopathic
intracranial hypertension.

## 2021-10-10 ENCOUNTER — Other Ambulatory Visit: Payer: Self-pay | Admitting: Internal Medicine

## 2021-10-10 DIAGNOSIS — G43709 Chronic migraine without aura, not intractable, without status migrainosus: Secondary | ICD-10-CM

## 2021-12-20 ENCOUNTER — Other Ambulatory Visit: Payer: Self-pay | Admitting: Internal Medicine

## 2021-12-20 DIAGNOSIS — G43709 Chronic migraine without aura, not intractable, without status migrainosus: Secondary | ICD-10-CM

## 2022-02-03 ENCOUNTER — Encounter: Payer: Medicaid Other | Admitting: Internal Medicine

## 2022-02-04 ENCOUNTER — Ambulatory Visit (INDEPENDENT_AMBULATORY_CARE_PROVIDER_SITE_OTHER): Payer: Medicaid Other | Admitting: Internal Medicine

## 2022-02-04 ENCOUNTER — Encounter: Payer: Self-pay | Admitting: Internal Medicine

## 2022-02-04 VITALS — BP 122/84 | HR 86 | Resp 18 | Ht 65.0 in | Wt 151.8 lb

## 2022-02-04 DIAGNOSIS — Z23 Encounter for immunization: Secondary | ICD-10-CM

## 2022-02-04 DIAGNOSIS — L700 Acne vulgaris: Secondary | ICD-10-CM | POA: Diagnosis not present

## 2022-02-04 DIAGNOSIS — Z0001 Encounter for general adult medical examination with abnormal findings: Secondary | ICD-10-CM | POA: Insufficient documentation

## 2022-02-04 DIAGNOSIS — H6062 Unspecified chronic otitis externa, left ear: Secondary | ICD-10-CM

## 2022-02-04 DIAGNOSIS — E559 Vitamin D deficiency, unspecified: Secondary | ICD-10-CM

## 2022-02-04 MED ORDER — CIPROFLOXACIN-DEXAMETHASONE 0.3-0.1 % OT SUSP: 4.0000 [drp] | Freq: Two times a day (BID) | OTIC | 0 refills | Status: DC

## 2022-02-04 NOTE — Patient Instructions (Signed)
Please continue to take medications as prescribed. ? ?Please use Ciprodex ear drops for ear discharge/infection. ?

## 2022-02-05 NOTE — Assessment & Plan Note (Signed)
Followed by [facility] ENT ?Has ear discharge and erythema, started Ciprodex ear drops ?

## 2022-02-05 NOTE — Progress Notes (Signed)
? ?Established Patient Office Visit ? ?Subjective:  ?Patient ID: Deanna Romero, female    DOB: 16-Aug-2001  Age: 21 y.o. MRN: [id number] ? ?CC:  ?Chief Complaint  ?Patient presents with  ? Annual Exam  ?  Annual exam   ? ? ?HPI ?Deanna Romero is a 21 y.o. female with past medical history of migraine, chronic otitis externa and thoracolumbar scoliosis who presents for annual physical. ? ?She complains of left ear discharge and redness currently.  She has history of chronic otitis externa.  She currently denies any fever or chills. ? ?She has h/o migraine, for which she takes Maxalt. She needs it rarely.  She denies any acute headache, nausea or vomiting currently.  She had an episode of intracranial hypertension, likely deemed to be due to minocycline and OCPs.  It improved after a lumbar puncture.  She had double vision at that time, which has improved since.  She denies any vision difficulty currently. ? ? ?Past Medical History:  ?Diagnosis Date  ? Chronic ear infection   ? Diplopia   ? Headache   ? Scoliosis   ? Tinnitus   ? ? ?Past Surgical History:  ?Procedure Laterality Date  ? No past surgery    ? ? ?Family History  ?Problem Relation Age of Onset  ? Migraines Mother   ? Migraines Father   ? ? ?Social History  ? ?Socioeconomic History  ? Marital status: Single  ?  Spouse name: Not on file  ? Number of children: 0  ? Years of education: in college  ? Highest education level: Not on file  ?Occupational History  ? Occupation: Ship broker  ?Tobacco Use  ? Smoking status: Never  ? Smokeless tobacco: Never  ?Substance and Sexual Activity  ? Alcohol use: Not Currently  ? Drug use: Never  ? Sexual activity: Not on file  ?Other Topics Concern  ? Not on file  ?Social History Narrative  ? Lives at home.  ? Right-handed.  ? No daily use of caffeine.  ? ?Social Determinants of Health  ? ?Financial Resource Strain: Not on file  ?Food Insecurity: Not on file  ?Transportation Needs: Not on file  ?Physical Activity: Not on file   ?Stress: Not on file  ?Social Connections: Not on file  ?Intimate Partner Violence: Not on file  ? ? ?Outpatient Medications Prior to Visit  ?Medication Sig Dispense Refill  ? aluminum chloride (DRYSOL) 20 % external solution Apply topically at bedtime.    ? clindamycin (CLEOCIN T) 1 % external solution Apply topically daily.    ? ibuprofen (ADVIL) 200 MG tablet Take 200 mg by mouth every 6 (six) hours as needed. Takes once per week.    ? rizatriptan (MAXALT) 10 MG tablet TAKE 1 TABLET AT ONSET OF MIGRAINE HEADACHE MAY REPEAT ONCE IN 2 HOURS (MAX OF 2 TABLETS/24 HOURS) 10 tablet 1  ? ?No facility-administered medications prior to visit.  ? ? ?Allergies  ?Allergen Reactions  ? Sulfamethoxazole Hives  ? ? ?ROS ?Review of Systems  ?Constitutional:  Negative for chills and fever.  ?HENT:  Positive for ear discharge. Negative for congestion, sinus pressure, sinus pain and sore throat.   ?Eyes:  Negative for pain and discharge.  ?Respiratory:  Negative for cough and shortness of breath.   ?Cardiovascular:  Negative for chest pain and palpitations.  ?Gastrointestinal:  Negative for abdominal pain, constipation, diarrhea, nausea and vomiting.  ?Endocrine: Negative for polydipsia and polyuria.  ?Genitourinary:  Negative for dysuria and hematuria.  ?  Musculoskeletal:  Negative for neck pain and neck stiffness.  ?Skin:  Negative for rash.  ?Neurological:  Negative for dizziness and weakness.  ?Psychiatric/Behavioral:  Negative for agitation and behavioral problems.   ? ?  ?Objective:  ?  ?Physical Exam ?Vitals reviewed.  ?Constitutional:   ?   General: She is not in acute distress. ?   Appearance: She is not diaphoretic.  ?HENT:  ?   Head: Normocephalic and atraumatic.  ?   Right Ear: Ear canal and external ear normal.  ?   Ears:  ?   Comments: Erythematous left ear external canal with whitish discharge ?   Nose: Nose normal.  ?   Mouth/Throat:  ?   Mouth: Mucous membranes are moist.  ?Eyes:  ?   General: No scleral icterus. ?    Extraocular Movements: Extraocular movements intact.  ?Cardiovascular:  ?   Rate and Rhythm: Normal rate and regular rhythm.  ?   Pulses: Normal pulses.  ?   Heart sounds: Normal heart sounds. No murmur heard. ?Pulmonary:  ?   Breath sounds: Normal breath sounds. No wheezing or rales.  ?Abdominal:  ?   Palpations: Abdomen is soft.  ?   Tenderness: There is no abdominal tenderness.  ?Musculoskeletal:  ?   Cervical back: Neck supple. No tenderness.  ?   Right lower leg: No edema.  ?   Left lower leg: No edema.  ?Skin: ?   General: Skin is warm.  ?   Findings: No rash.  ?   Comments: Papular rash over forehead - acne  ?Neurological:  ?   General: No focal deficit present.  ?   Mental Status: She is alert and oriented to person, place, and time.  ?   Cranial Nerves: No cranial nerve deficit.  ?   Sensory: No sensory deficit.  ?   Motor: No weakness.  ?Psychiatric:     ?   Mood and Affect: Mood normal.     ?   Behavior: Behavior normal.  ? ? ?BP 122/84 (BP Location: Right Arm, Patient Position: Sitting, Cuff Size: Normal)   Pulse 86   Resp 18   Ht 5' 5"  (1.651 m)   Wt 151 lb 12.8 oz (68.9 kg)   SpO2 99%   BMI 25.26 kg/m?  ?Wt Readings from Last 3 Encounters:  ?02/04/22 151 lb 12.8 oz (68.9 kg)  ?08/05/21 152 lb 1.9 oz (69 kg)  ?11/20/20 139 lb 8 oz (63.3 kg)  ? ? ?Lab Results  ?Component Value Date  ? TSH 1.740 08/09/2021  ? ?Lab Results  ?Component Value Date  ? WBC 6.0 08/09/2021  ? HGB 13.3 08/09/2021  ? HCT 39.1 08/09/2021  ? MCV 90 08/09/2021  ? PLT 382 08/09/2021  ? ?Lab Results  ?Component Value Date  ? NA 148 (H) 08/09/2021  ? K 5.1 08/09/2021  ? CO2 17 (L) 08/09/2021  ? GLUCOSE 97 08/09/2021  ? BUN 11 08/09/2021  ? CREATININE 0.84 08/09/2021  ? BILITOT 0.5 08/09/2021  ? ALKPHOS 78 08/09/2021  ? AST 21 08/09/2021  ? ALT 20 08/09/2021  ? PROT 7.3 08/09/2021  ? ALBUMIN 4.6 08/09/2021  ? CALCIUM 9.5 08/09/2021  ? EGFR 101 08/09/2021  ? ?Lab Results  ?Component Value Date  ? CHOL 186 08/09/2021  ? ?Lab  Results  ?Component Value Date  ? HDL 61 08/09/2021  ? ?Lab Results  ?Component Value Date  ? LDLCALC 113 (H) 08/09/2021  ? ?Lab Results  ?Component Value  Date  ? TRIG 66 08/09/2021  ? ?Lab Results  ?Component Value Date  ? CHOLHDL 3.0 08/09/2021  ? ?No results found for: HGBA1C ? ?  ?Assessment & Plan:  ? ?Problem List Items Addressed This Visit   ? ?  ? Nervous and Auditory  ? Chronic otitis externa of left ear  ?  Followed by [facility] ENT ?Has ear discharge and erythema, started Ciprodex ear drops ?  ?  ? Relevant Medications  ? ciprofloxacin-dexamethasone (CIPRODEX) OTIC suspension  ?  ? Musculoskeletal and Integument  ? Acne vulgaris  ?  Followed by dermatology ?Uses clindamycin solution ?  ?  ?  ? Other  ? Encounter for general adult medical examination with abnormal findings - Primary  ?  Physical exam as documented. ?Fasting blood tests ordered. ?TDaP vaccine in the office today. ? ?  ?  ? Relevant Orders  ? TSH  ? Lipid panel  ? CMP14+EGFR  ? CBC with Differential/Platelet  ? ?Other Visit Diagnoses   ? ? Vitamin D deficiency      ? Relevant Orders  ? VITAMIN D 25 Hydroxy (Vit-D Deficiency, Fractures)  ? Need for Tdap vaccination      ? Relevant Orders  ? Tdap vaccine greater than or equal to 7yo IM (Completed)  ? ?  ? ? ?Meds ordered this encounter  ?Medications  ? ciprofloxacin-dexamethasone (CIPRODEX) OTIC suspension  ?  Sig: Place 4 drops into the left ear 2 (two) times daily.  ?  Dispense:  7.5 mL  ?  Refill:  0  ? ? ?Follow-up: Return in about 6 months (around 08/06/2022) for HLD.  ? ? ?Lindell Spar, MD ?

## 2022-02-05 NOTE — Assessment & Plan Note (Signed)
Followed by dermatology ?Uses clindamycin solution ?

## 2022-02-05 NOTE — Assessment & Plan Note (Signed)
Physical exam as documented. ?Fasting blood tests ordered. ?TDaP vaccine in the office today. ? ?

## 2022-02-24 ENCOUNTER — Other Ambulatory Visit: Payer: Self-pay | Admitting: Internal Medicine

## 2022-02-24 DIAGNOSIS — G43709 Chronic migraine without aura, not intractable, without status migrainosus: Secondary | ICD-10-CM

## 2022-07-23 ENCOUNTER — Other Ambulatory Visit: Payer: Self-pay | Admitting: Internal Medicine

## 2022-07-23 DIAGNOSIS — G43709 Chronic migraine without aura, not intractable, without status migrainosus: Secondary | ICD-10-CM

## 2022-07-30 ENCOUNTER — Encounter: Payer: Self-pay | Admitting: Internal Medicine

## 2022-07-30 ENCOUNTER — Ambulatory Visit (INDEPENDENT_AMBULATORY_CARE_PROVIDER_SITE_OTHER): Payer: Medicaid Other | Admitting: Internal Medicine

## 2022-07-30 VITALS — BP 124/82 | HR 94 | Resp 18 | Ht 65.0 in | Wt 144.8 lb

## 2022-07-30 DIAGNOSIS — H65112 Acute and subacute allergic otitis media (mucoid) (sanguinous) (serous), left ear: Secondary | ICD-10-CM

## 2022-07-30 DIAGNOSIS — J0110 Acute frontal sinusitis, unspecified: Secondary | ICD-10-CM | POA: Diagnosis not present

## 2022-07-30 MED ORDER — AMOXICILLIN-POT CLAVULANATE 875-125 MG PO TABS: 1.0000 | ORAL_TABLET | Freq: Two times a day (BID) | ORAL | 0 refills | Status: DC

## 2022-07-30 NOTE — Progress Notes (Signed)
Acute Office Visit  Subjective:    Patient ID: Deanna Romero, female    DOB: 03-21-2001, 21 y.o.   MRN: [id number]  Chief Complaint  Patient presents with   Sinus Problem    Pt lymph nodes are swollen left ear pain runny nose started 07/28/22    HPI Patient is in today for complaint of left ear pain, swollen lymph node near left ear, nasal congestion and sinus pressure for the last 3 days.  She denies any fever or chills.  Denies any ear discharge.  Of note, she has history of recurrent otitis media.  She currently denies any sore throat, dyspnea or wheezing.  Past Medical History:  Diagnosis Date   Chronic ear infection    Diplopia    Headache    Scoliosis    Tinnitus     Past Surgical History:  Procedure Laterality Date   No past surgery      Family History  Problem Relation Age of Onset   Migraines Mother    Migraines Father     Social History   Socioeconomic History   Marital status: Single    Spouse name: Not on file   Number of children: 0   Years of education: in college   Highest education level: Not on file  Occupational History   Occupation: Student  Tobacco Use   Smoking status: Never   Smokeless tobacco: Never  Substance and Sexual Activity   Alcohol use: Not Currently   Drug use: Never   Sexual activity: Not on file  Other Topics Concern   Not on file  Social History Narrative   Lives at home.   Right-handed.   No daily use of caffeine.   Social Determinants of Health   Financial Resource Strain: Not on file  Food Insecurity: Not on file  Transportation Needs: Not on file  Physical Activity: Not on file  Stress: Not on file  Social Connections: Not on file  Intimate Partner Violence: Not on file    Outpatient Medications Prior to Visit  Medication Sig Dispense Refill   aluminum chloride (DRYSOL) 20 % external solution Apply topically at bedtime.     clindamycin (CLEOCIN T) 1 % external solution Apply topically daily.     ibuprofen  (ADVIL) 200 MG tablet Take 200 mg by mouth every 6 (six) hours as needed. Takes once per week.     rizatriptan (MAXALT) 10 MG tablet TAKE 1 TABLET AT ONSET OF MIGRAINE HEADACHE MAY REPEAT ONCE IN 2 HOURS (MAX OF 2 TABLETS/24 HOURS) 10 tablet 1   ciprofloxacin-dexamethasone (CIPRODEX) OTIC suspension Place 4 drops into the left ear 2 (two) times daily. (Patient not taking: Reported on 07/30/2022) 7.5 mL 0   No facility-administered medications prior to visit.    Allergies  Allergen Reactions   Sulfamethoxazole Hives    Review of Systems  Constitutional:  Negative for chills and fever.  HENT:  Positive for ear pain, postnasal drip, sinus pressure and sinus pain. Negative for congestion, ear discharge and sore throat.   Eyes:  Negative for pain and discharge.  Respiratory:  Negative for cough and shortness of breath.   Cardiovascular:  Negative for chest pain and palpitations.  Gastrointestinal:  Negative for abdominal pain, nausea and vomiting.  Endocrine: Negative for polydipsia and polyuria.  Genitourinary:  Negative for dysuria and hematuria.  Musculoskeletal:  Negative for neck pain and neck stiffness.  Skin:  Negative for rash.  Neurological:  Negative for dizziness and weakness.  Psychiatric/Behavioral:  Negative for agitation and behavioral problems.        Objective:    Physical Exam Vitals reviewed.  Constitutional:      General: She is not in acute distress.    Appearance: She is not diaphoretic.  HENT:     Head: Normocephalic and atraumatic.     Right Ear: Ear canal and external ear normal.     Ears:     Comments: Erythematous left ear external canal with whitish discharge Superficial cervical LAD    Nose: Nose normal.     Mouth/Throat:     Mouth: Mucous membranes are moist.  Eyes:     General: No scleral icterus.    Extraocular Movements: Extraocular movements intact.  Cardiovascular:     Rate and Rhythm: Normal rate and regular rhythm.     Heart sounds:  Normal heart sounds. No murmur heard. Pulmonary:     Breath sounds: Normal breath sounds. No wheezing or rales.  Musculoskeletal:     Cervical back: Neck supple. No tenderness.     Right lower leg: No edema.     Left lower leg: No edema.  Skin:    General: Skin is warm.     Comments: Papular rash over forehead - acne  Neurological:     General: No focal deficit present.     Mental Status: She is alert and oriented to person, place, and time.  Psychiatric:        Mood and Affect: Mood normal.        Behavior: Behavior normal.     BP 124/82 (BP Location: Right Arm, Patient Position: Sitting, Cuff Size: Normal)   Pulse 94   Resp 18   Ht 5\' 5"  (1.651 m)   Wt 144 lb 12.8 oz (65.7 kg)   SpO2 99%   BMI 24.10 kg/m  Wt Readings from Last 3 Encounters:  07/30/22 144 lb 12.8 oz (65.7 kg)  02/04/22 151 lb 12.8 oz (68.9 kg)  08/05/21 152 lb 1.9 oz (69 kg)        Assessment & Plan:   Problem List Items Addressed This Visit    Visit Diagnoses     Acute non-recurrent frontal sinusitis    -  Primary   Acute mucoid otitis media of left ear     Started Augmentin Has had recurrent otitis external/media in the past, has had ENT evaluation Superficial cervical LAD likely due to current infection     Relevant Medications   amoxicillin-clavulanate (AUGMENTIN) 875-125 MG tablet        Meds ordered this encounter  Medications   amoxicillin-clavulanate (AUGMENTIN) 875-125 MG tablet    Sig: Take 1 tablet by mouth 2 (two) times daily.    Dispense:  14 tablet    Refill:  0     Rajan Burgard Keith Rake, MD

## 2022-08-06 ENCOUNTER — Ambulatory Visit: Payer: Medicaid Other | Admitting: Internal Medicine

## 2022-11-03 ENCOUNTER — Ambulatory Visit: Payer: Medicaid Other | Admitting: Internal Medicine

## 2022-11-03 ENCOUNTER — Encounter: Payer: Self-pay | Admitting: Internal Medicine

## 2022-11-03 VITALS — BP 134/88 | HR 104 | Ht 65.0 in | Wt 137.8 lb

## 2022-11-03 DIAGNOSIS — G43709 Chronic migraine without aura, not intractable, without status migrainosus: Secondary | ICD-10-CM | POA: Diagnosis not present

## 2022-11-03 DIAGNOSIS — M25842 Other specified joint disorders, left hand: Secondary | ICD-10-CM | POA: Diagnosis not present

## 2022-11-03 DIAGNOSIS — H6062 Unspecified chronic otitis externa, left ear: Secondary | ICD-10-CM | POA: Diagnosis not present

## 2022-11-03 NOTE — Assessment & Plan Note (Addendum)
Likely benign, but has slight discomfort due to location ROM currently intact Referred to hand surgeon

## 2022-11-03 NOTE — Assessment & Plan Note (Signed)
Controlled with Maxalt as needed

## 2022-11-03 NOTE — Patient Instructions (Signed)
Okay to apply warm compresses over cyst area.  Okay to take Ibuprofen as needed for pain.

## 2022-11-03 NOTE — Progress Notes (Signed)
Acute Office Visit  Subjective:    Patient ID: Deanna Romero, female    DOB: June 21, 2001, 22 y.o.   MRN: [id number]  Chief Complaint  Patient presents with   Finger Issue    Patient states she has a spot on her right index finger, has has it for a while now    HPI Patient is in today for left index finger mass that she noticed from 04/26/22.  She denies any recent change in size.  She has mild pain when it comes into contact with hard objects, such as while touching the doorknob.  She denies any recent injury.  Denies any swelling or redness.  ROM at left index finger MCP, PIP and DIP joints are intact.  Past Medical History:  Diagnosis Date   Chronic ear infection    Diplopia    Headache    Scoliosis    Tinnitus     Past Surgical History:  Procedure Laterality Date   No past surgery      Family History  Problem Relation Age of Onset   Migraines Mother    Migraines Father     Social History   Socioeconomic History   Marital status: Single    Spouse name: Not on file   Number of children: 0   Years of education: in college   Highest education level: Not on file  Occupational History   Occupation: Student  Tobacco Use   Smoking status: Never   Smokeless tobacco: Never  Substance and Sexual Activity   Alcohol use: Not Currently   Drug use: Never   Sexual activity: Not on file  Other Topics Concern   Not on file  Social History Narrative   Lives at home.   Right-handed.   No daily use of caffeine.   Social Determinants of Health   Financial Resource Strain: Not on file  Food Insecurity: Not on file  Transportation Needs: Not on file  Physical Activity: Not on file  Stress: Not on file  Social Connections: Not on file  Intimate Partner Violence: Not on file    Outpatient Medications Prior to Visit  Medication Sig Dispense Refill   aluminum chloride (DRYSOL) 20 % external solution Apply topically at bedtime.     Clindamycin-Benzoyl Per, Refr, gel       ibuprofen (ADVIL) 200 MG tablet Take 200 mg by mouth every 6 (six) hours as needed. Takes once per week.     rizatriptan (MAXALT) 10 MG tablet TAKE 1 TABLET AT ONSET OF MIGRAINE HEADACHE MAY REPEAT ONCE IN 2 HOURS (MAX OF 2 TABLETS/24 HOURS) 10 tablet 1   sodium fluoride (DENTA 5000 PLUS) 1.1 % CREA dental cream      clindamycin (CLEOCIN T) 1 % external solution Apply topically daily. (Patient not taking: Reported on 11/03/2022)     Glycopyrronium Tosylate (QBREXZA) 2.4 % PADS  (Patient not taking: Reported on 11/03/2022)     amoxicillin-clavulanate (AUGMENTIN) 875-125 MG tablet Take 1 tablet by mouth 2 (two) times daily. (Patient not taking: Reported on 11/03/2022) 14 tablet 0   No facility-administered medications prior to visit.    Allergies  Allergen Reactions   Sulfamethoxazole Hives    Review of Systems  Constitutional:  Negative for chills and fever.  Musculoskeletal:  Negative for arthralgias, back pain, neck pain and neck stiffness.  Neurological:  Negative for dizziness, tremors, weakness and numbness.  Psychiatric/Behavioral:  Negative for agitation and behavioral problems.        Objective:  Physical Exam Vitals reviewed.  Constitutional:      General: She is not in acute distress.    Appearance: She is not diaphoretic.  HENT:     Head: Normocephalic and atraumatic.     Right Ear: Ear canal and external ear normal.     Nose: Nose normal.     Mouth/Throat:     Mouth: Mucous membranes are moist.  Eyes:     General: No scleral icterus.    Extraocular Movements: Extraocular movements intact.  Cardiovascular:     Rate and Rhythm: Normal rate and regular rhythm.     Heart sounds: Normal heart sounds. No murmur heard. Pulmonary:     Breath sounds: Normal breath sounds. No wheezing or rales.  Musculoskeletal:     Cervical back: Neck supple. No tenderness.     Right lower leg: No edema.     Left lower leg: No edema.     Comments: About 0.5 cm in diameter cyst near  MCP joint of left index finger  Skin:    General: Skin is warm.     Comments: Papular rash over forehead - acne  Neurological:     General: No focal deficit present.     Mental Status: She is alert and oriented to person, place, and time.  Psychiatric:        Mood and Affect: Mood normal.        Behavior: Behavior normal.     BP 134/88 (BP Location: Left Arm, Patient Position: Sitting, Cuff Size: Normal)   Pulse (!) 104   Ht 5\' 5"  (1.651 m)   Wt 137 lb 12.8 oz (62.5 kg)   SpO2 100%   BMI 22.93 kg/m  Wt Readings from Last 3 Encounters:  11/03/22 137 lb 12.8 oz (62.5 kg)  07/30/22 144 lb 12.8 oz (65.7 kg)  02/04/22 151 lb 12.8 oz (68.9 kg)        Assessment & Plan:   Problem List Items Addressed This Visit       Cardiovascular and Mediastinum   Chronic migraine without aura without status migrainosus, not intractable    Controlled with Maxalt as needed        Nervous and Auditory   Chronic otitis externa of left ear    Followed by [facility] Had ear discharge and erythema, resolved with Ciprodex ear drops        Musculoskeletal and Integument   Cyst of joint of left hand - Primary    Likely benign, but has slight discomfort due to location ROM currently intact Referred to hand surgeon      Relevant Orders   Ambulatory referral to Orthopedic Surgery     No orders of the defined types were placed in this encounter.    [facility], MD

## 2022-11-03 NOTE — Assessment & Plan Note (Signed)
Followed by [facility] ENT Had ear discharge and erythema, resolved with Ciprodex ear drops

## 2022-12-10 LAB — HM PAP SMEAR
HM Pap smear: NEGATIVE
HM Pap smear: NEGATIVE

## 2023-01-30 ENCOUNTER — Encounter: Payer: Self-pay | Admitting: Internal Medicine

## 2023-01-30 ENCOUNTER — Other Ambulatory Visit: Payer: Self-pay | Admitting: Internal Medicine

## 2023-01-30 ENCOUNTER — Ambulatory Visit: Payer: Medicaid Other | Admitting: Internal Medicine

## 2023-01-30 VITALS — BP 122/74 | HR 75 | Ht 65.0 in | Wt 136.8 lb

## 2023-01-30 DIAGNOSIS — E559 Vitamin D deficiency, unspecified: Secondary | ICD-10-CM | POA: Diagnosis not present

## 2023-01-30 DIAGNOSIS — Z23 Encounter for immunization: Secondary | ICD-10-CM

## 2023-01-30 DIAGNOSIS — H6062 Unspecified chronic otitis externa, left ear: Secondary | ICD-10-CM

## 2023-01-30 DIAGNOSIS — G43709 Chronic migraine without aura, not intractable, without status migrainosus: Secondary | ICD-10-CM | POA: Diagnosis not present

## 2023-01-30 DIAGNOSIS — E782 Mixed hyperlipidemia: Secondary | ICD-10-CM | POA: Diagnosis not present

## 2023-01-30 DIAGNOSIS — M25842 Other specified joint disorders, left hand: Secondary | ICD-10-CM

## 2023-01-30 NOTE — Assessment & Plan Note (Signed)
Followed by [facility] ENT Has not had ear discharge and erythema recently

## 2023-01-30 NOTE — Assessment & Plan Note (Signed)
Likely benign, but has slight discomfort due to location ROM currently intact Referred to hand surgeon - had aspiration, has recurred, but smaller now

## 2023-01-30 NOTE — Progress Notes (Signed)
Established Patient Office Visit  Subjective:  Patient ID: Deanna Romero, female    DOB: Oct 01, 2001  Age: 22 y.o. MRN: [id number]  CC:  Chief Complaint  Patient presents with   Hyperlipidemia    Follow up    HPI Deanna Milerin Klingerman is a 22 y.o. female with past medical history of migraine, chronic otitis externa and thoracolumbar scoliosis who presents for f/u of her chronic medical conditions.  She has h/o migraine, for which she takes Maxalt. She needs it rarely.  She denies any acute headache, nausea or vomiting currently.  She had an episode of intracranial hypertension in the past, likely deemed to be due to minocycline and OCPs.  It improved after a lumbar puncture.  She had double vision at that time, which has improved since.  She denies any vision difficulty currently.     Past Medical History:  Diagnosis Date   Chronic ear infection    Diplopia    Headache    Scoliosis    Tinnitus     Past Surgical History:  Procedure Laterality Date   No past surgery      Family History  Problem Relation Age of Onset   Migraines Mother    Migraines Father     Social History   Socioeconomic History   Marital status: Single    Spouse name: Not on file   Number of children: 0   Years of education: in college   Highest education level: Not on file  Occupational History   Occupation: Student  Tobacco Use   Smoking status: Never   Smokeless tobacco: Never  Substance and Sexual Activity   Alcohol use: Not Currently   Drug use: Never   Sexual activity: Not on file  Other Topics Concern   Not on file  Social History Narrative   Lives at home.   Right-handed.   No daily use of caffeine.   Social Determinants of Health   Financial Resource Strain: Not on file  Food Insecurity: Not on file  Transportation Needs: Not on file  Physical Activity: Not on file  Stress: Not on file  Social Connections: Not on file  Intimate Partner Violence: Not on file    Outpatient  Medications Prior to Visit  Medication Sig Dispense Refill   aluminum chloride (DRYSOL) 20 % external solution Apply topically at bedtime.     Clindamycin-Benzoyl Per, Refr, gel      ibuprofen (ADVIL) 200 MG tablet Take 200 mg by mouth every 6 (six) hours as needed. Takes once per week.     rizatriptan (MAXALT) 10 MG tablet TAKE 1 TABLET AT ONSET OF MIGRAINE HEADACHE MAY REPEAT ONCE IN 2 HOURS (MAX OF 2 TABLETS/24 HOURS) 10 tablet 1   sodium fluoride (DENTA 5000 PLUS) 1.1 % CREA dental cream      clindamycin (CLEOCIN T) 1 % external solution Apply topically daily. (Patient not taking: Reported on 11/03/2022)     Glycopyrronium Tosylate (QBREXZA) 2.4 % PADS  (Patient not taking: Reported on 11/03/2022)     No facility-administered medications prior to visit.    Allergies  Allergen Reactions   Sulfamethoxazole Hives    ROS Review of Systems  Constitutional:  Negative for chills and fever.  HENT:  Negative for congestion, ear discharge, ear pain and sore throat.   Eyes:  Negative for pain and discharge.  Respiratory:  Negative for cough and shortness of breath.   Cardiovascular:  Negative for chest pain and palpitations.  Gastrointestinal:  Negative  for abdominal pain, nausea and vomiting.  Endocrine: Negative for polydipsia and polyuria.  Genitourinary:  Negative for dysuria and hematuria.  Musculoskeletal:  Negative for neck pain and neck stiffness.  Skin:  Negative for rash.  Neurological:  Negative for dizziness and weakness.  Psychiatric/Behavioral:  Negative for agitation and behavioral problems.       Objective:    Physical Exam Vitals reviewed.  Constitutional:      General: She is not in acute distress.    Appearance: She is not diaphoretic.  HENT:     Head: Normocephalic and atraumatic.     Right Ear: Ear canal and external ear normal.     Nose: Nose normal.     Mouth/Throat:     Mouth: Mucous membranes are moist.  Eyes:     General: No scleral icterus.     Extraocular Movements: Extraocular movements intact.  Cardiovascular:     Rate and Rhythm: Normal rate and regular rhythm.     Heart sounds: Normal heart sounds. No murmur heard. Pulmonary:     Breath sounds: Normal breath sounds. No wheezing or rales.  Musculoskeletal:     Cervical back: Neck supple. No tenderness.     Right lower leg: No edema.     Left lower leg: No edema.     Comments: Tiny cyst near MCP joint of left index finger  Skin:    General: Skin is warm.     Comments: Papular rash over forehead - acne  Neurological:     General: No focal deficit present.     Mental Status: She is alert and oriented to person, place, and time.  Psychiatric:        Mood and Affect: Mood normal.        Behavior: Behavior normal.     BP 122/74 (BP Location: Right Arm, Patient Position: Sitting, Cuff Size: Normal)   Pulse 75   Ht 5\' 5"  (1.651 m)   Wt 136 lb 12.8 oz (62.1 kg)   SpO2 100%   BMI 22.76 kg/m  Wt Readings from Last 3 Encounters:  01/30/23 136 lb 12.8 oz (62.1 kg)  11/03/22 137 lb 12.8 oz (62.5 kg)  07/30/22 144 lb 12.8 oz (65.7 kg)    Lab Results  Component Value Date   TSH 1.740 08/09/2021   Lab Results  Component Value Date   WBC 6.0 08/09/2021   HGB 13.3 08/09/2021   HCT 39.1 08/09/2021   MCV 90 08/09/2021   PLT 382 08/09/2021   Lab Results  Component Value Date   NA 148 (H) 08/09/2021   K 5.1 08/09/2021   CO2 17 (L) 08/09/2021   GLUCOSE 97 08/09/2021   BUN 11 08/09/2021   CREATININE 0.84 08/09/2021   BILITOT 0.5 08/09/2021   ALKPHOS 78 08/09/2021   AST 21 08/09/2021   ALT 20 08/09/2021   PROT 7.3 08/09/2021   ALBUMIN 4.6 08/09/2021   CALCIUM 9.5 08/09/2021   EGFR 101 08/09/2021   Lab Results  Component Value Date   CHOL 186 08/09/2021   Lab Results  Component Value Date   HDL 61 08/09/2021   Lab Results  Component Value Date   LDLCALC 113 (H) 08/09/2021   Lab Results  Component Value Date   TRIG 66 08/09/2021   Lab Results   Component Value Date   CHOLHDL 3.0 08/09/2021   No results found for: "HGBA1C"    Assessment & Plan:   Problem List Items Addressed This Visit  Cardiovascular and Mediastinum   Chronic migraine without aura without status migrainosus, not intractable - Primary    Controlled with Maxalt as needed      Relevant Orders   TSH   CMP14+EGFR   CBC with Differential/Platelet     Nervous and Auditory   Chronic otitis externa of left ear    [facility] Has not had ear discharge and erythema recently      Relevant Orders   CBC with Differential/Platelet     Musculoskeletal and Integument   Cyst of joint of left hand    Likely benign, but has slight discomfort due to location ROM currently intact Referred to hand surgeon - had aspiration, has recurred, but smaller now        Other   Mixed hyperlipidemia    Check lipid profile Advised to follow low cholesterol diet for now      Relevant Orders   Lipid panel   Other Visit Diagnoses     Vitamin D deficiency       Relevant Orders   VITAMIN D 25 Hydroxy (Vit-D Deficiency, Fractures)       No orders of the defined types were placed in this encounter.   Follow-up: Return in about 9 months (around 11/01/2023) for Annual physical.    Anabel Halonutwik K Shenita Trego, MD

## 2023-01-30 NOTE — Assessment & Plan Note (Signed)
Controlled with Maxalt as needed 

## 2023-01-30 NOTE — Patient Instructions (Signed)
Please get HPV vaccines around following dates:  HPV #1: Today HPV #2: 03/01/23 HPV #3: 08/01/23  Please continue to follow low cholesterol diet and perform moderate exercise/walking at least 150 mins/week.

## 2023-01-30 NOTE — Assessment & Plan Note (Signed)
Check lipid profile Advised to follow low cholesterol diet for now 

## 2023-01-31 LAB — CMP14+EGFR
ALT: 36 IU/L — ABNORMAL HIGH (ref 0–32)
AST: 28 IU/L (ref 0–40)
Albumin/Globulin Ratio: 2.1 (ref 1.2–2.2)
Albumin: 4.6 g/dL (ref 4.0–5.0)
Alkaline Phosphatase: 73 IU/L (ref 44–121)
BUN/Creatinine Ratio: 13 (ref 9–23)
BUN: 9 mg/dL (ref 6–20)
Bilirubin Total: 0.7 mg/dL (ref 0.0–1.2)
CO2: 21 mmol/L (ref 20–29)
Calcium: 9.5 mg/dL (ref 8.7–10.2)
Chloride: 103 mmol/L (ref 96–106)
Creatinine, Ser: 0.71 mg/dL (ref 0.57–1.00)
Globulin, Total: 2.2 g/dL (ref 1.5–4.5)
Glucose: 90 mg/dL (ref 70–99)
Potassium: 4.5 mmol/L (ref 3.5–5.2)
Sodium: 140 mmol/L (ref 134–144)
Total Protein: 6.8 g/dL (ref 6.0–8.5)
eGFR: 123 mL/min/{1.73_m2} (ref >59)

## 2023-01-31 LAB — LIPID PANEL
Chol/HDL Ratio: 2.7 ratio (ref 0.0–4.4)
Cholesterol, Total: 171 mg/dL (ref 100–199)
HDL: 64 mg/dL (ref >39)
LDL Chol Calc (NIH): 94 mg/dL (ref 0–99)
Triglycerides: 70 mg/dL (ref 0–149)
VLDL Cholesterol Cal: 13 mg/dL (ref 5–40)

## 2023-01-31 LAB — CBC WITH DIFFERENTIAL/PLATELET
Basophils Absolute: 0 10*3/uL (ref 0.0–0.2)
Basos: 1 %
EOS (ABSOLUTE): 0.1 10*3/uL (ref 0.0–0.4)
Eos: 2 %
Hematocrit: 38.9 % (ref 34.0–46.6)
Hemoglobin: 13.2 g/dL (ref 11.1–15.9)
Immature Grans (Abs): 0 10*3/uL (ref 0.0–0.1)
Immature Granulocytes: 0 %
Lymphocytes Absolute: 1.5 10*3/uL (ref 0.7–3.1)
Lymphs: 19 %
MCH: 30.4 pg (ref 26.6–33.0)
MCHC: 33.9 g/dL (ref 31.5–35.7)
MCV: 90 fL (ref 79–97)
Monocytes Absolute: 0.5 10*3/uL (ref 0.1–0.9)
Monocytes: 7 %
Neutrophils Absolute: 5.8 10*3/uL (ref 1.4–7.0)
Neutrophils: 71 %
Platelets: 380 10*3/uL (ref 150–450)
RBC: 4.34 x10E6/uL (ref 3.77–5.28)
RDW: 13.2 % (ref 11.7–15.4)
WBC: 8 10*3/uL (ref 3.4–10.8)

## 2023-01-31 LAB — VITAMIN D 25 HYDROXY (VIT D DEFICIENCY, FRACTURES): Vit D, 25-Hydroxy: 17.1 ng/mL — ABNORMAL LOW (ref 30.0–100.0)

## 2023-01-31 LAB — TSH: TSH: 2.29 u[IU]/mL (ref 0.450–4.500)

## 2023-03-03 ENCOUNTER — Ambulatory Visit (INDEPENDENT_AMBULATORY_CARE_PROVIDER_SITE_OTHER): Payer: Medicaid Other

## 2023-03-03 DIAGNOSIS — Z23 Encounter for immunization: Secondary | ICD-10-CM | POA: Diagnosis not present

## 2023-03-12 ENCOUNTER — Other Ambulatory Visit: Payer: Self-pay | Admitting: Internal Medicine

## 2023-03-12 DIAGNOSIS — G43709 Chronic migraine without aura, not intractable, without status migrainosus: Secondary | ICD-10-CM

## 2023-08-06 ENCOUNTER — Other Ambulatory Visit: Payer: Self-pay | Admitting: Internal Medicine

## 2023-08-06 DIAGNOSIS — G43709 Chronic migraine without aura, not intractable, without status migrainosus: Secondary | ICD-10-CM

## 2023-08-14 ENCOUNTER — Ambulatory Visit (INDEPENDENT_AMBULATORY_CARE_PROVIDER_SITE_OTHER): Payer: Medicaid Other

## 2023-08-14 DIAGNOSIS — Z23 Encounter for immunization: Secondary | ICD-10-CM

## 2023-10-13 ENCOUNTER — Other Ambulatory Visit: Payer: Self-pay | Admitting: Internal Medicine

## 2023-10-13 DIAGNOSIS — G43709 Chronic migraine without aura, not intractable, without status migrainosus: Secondary | ICD-10-CM

## 2023-11-02 ENCOUNTER — Encounter: Payer: Medicaid Other | Admitting: Internal Medicine

## 2023-11-17 ENCOUNTER — Ambulatory Visit (INDEPENDENT_AMBULATORY_CARE_PROVIDER_SITE_OTHER): Payer: Medicaid Other | Admitting: Internal Medicine

## 2023-11-17 ENCOUNTER — Encounter: Payer: Self-pay | Admitting: Internal Medicine

## 2023-11-17 VITALS — BP 118/81 | HR 80 | Ht 65.0 in | Wt 140.6 lb

## 2023-11-17 DIAGNOSIS — H6062 Unspecified chronic otitis externa, left ear: Secondary | ICD-10-CM

## 2023-11-17 DIAGNOSIS — Z0001 Encounter for general adult medical examination with abnormal findings: Secondary | ICD-10-CM | POA: Diagnosis not present

## 2023-11-17 DIAGNOSIS — L02419 Cutaneous abscess of limb, unspecified: Secondary | ICD-10-CM | POA: Insufficient documentation

## 2023-11-17 DIAGNOSIS — E782 Mixed hyperlipidemia: Secondary | ICD-10-CM

## 2023-11-17 DIAGNOSIS — G43709 Chronic migraine without aura, not intractable, without status migrainosus: Secondary | ICD-10-CM

## 2023-11-17 DIAGNOSIS — E559 Vitamin D deficiency, unspecified: Secondary | ICD-10-CM

## 2023-11-17 MED ORDER — RIZATRIPTAN BENZOATE 10 MG PO TABS: 10.0000 mg | ORAL_TABLET | ORAL | 3 refills | Status: AC | PRN

## 2023-11-17 MED ORDER — DOXYCYCLINE HYCLATE 100 MG PO TABS: 100.0000 mg | ORAL_TABLET | Freq: Two times a day (BID) | ORAL | 0 refills | Status: AC

## 2023-11-17 NOTE — Assessment & Plan Note (Signed)
Started empiric doxycycline Advised to apply warm compresses If persistent, may need I&D

## 2023-11-17 NOTE — Assessment & Plan Note (Signed)
Check lipid profile Advised to follow low cholesterol diet for now 

## 2023-11-17 NOTE — Assessment & Plan Note (Signed)
Followed by [facility] ENT Has not had ear discharge and erythema recently

## 2023-11-17 NOTE — Assessment & Plan Note (Signed)
Controlled with Maxalt as needed 

## 2023-11-17 NOTE — Assessment & Plan Note (Signed)
Physical exam as documented. Fasting blood tests ordered. 

## 2023-11-17 NOTE — Progress Notes (Signed)
Established Patient Office Visit  Subjective:  Patient ID: Deanna Romero, female    DOB: 01-19-2001  Age: 23 y.o. MRN: [id number]  CC:  Chief Complaint  Patient presents with   Annual Exam    Cpe today   Mass    Pt reports a bump under her armpit area on her right arm.     HPI Deanna Romero is a 23 y.o. female with past medical history of migraine, chronic otitis externa and thoracolumbar scoliosis who presents for annual physical.  She reports having a bump in the right axilla for the last 1 week.  She has noticed local warmth and redness.  Denies any fever or chills.  She has h/o migraine, for which she takes Maxalt. She needs it rarely.  She denies any acute headache, nausea or vomiting currently.  She had an episode of intracranial hypertension in the past, likely deemed to be due to minocycline and OCPs.  It improved after a lumbar puncture.  She had double vision at that time, which has improved since.  She denies any vision difficulty currently.     Past Medical History:  Diagnosis Date   Chronic ear infection    Diplopia    Headache    Scoliosis    Tinnitus     Past Surgical History:  Procedure Laterality Date   No past surgery      Family History  Problem Relation Age of Onset   Migraines Mother    Migraines Father     Social History   Socioeconomic History   Marital status: Single    Spouse name: Not on file   Number of children: 0   Years of education: in college   Highest education level: Master's degree (e.g., MA, MS, MEng, MEd, MSW, MBA)  Occupational History   Occupation: Student  Tobacco Use   Smoking status: Never   Smokeless tobacco: Never  Substance and Sexual Activity   Alcohol use: Not Currently   Drug use: Never   Sexual activity: Not on file  Other Topics Concern   Not on file  Social History Narrative   Lives at home.   Right-handed.   No daily use of caffeine.   Social Drivers of Corporate investment banker Strain: Low  Risk  (11/16/2023)   Overall Financial Resource Strain (CARDIA)    Difficulty of Paying Living Expenses: Not very hard  Food Insecurity: No Food Insecurity (11/16/2023)   Hunger Vital Sign    Worried About Running Out of Food in the Last Year: Never true    Ran Out of Food in the Last Year: Never true  Transportation Needs: No Transportation Needs (11/16/2023)   PRAPARE - Administrator, Civil Service (Medical): No    Lack of Transportation (Non-Medical): No  Physical Activity: Insufficiently Active (11/16/2023)   Exercise Vital Sign    Days of Exercise per Week: 3 days    Minutes of Exercise per Session: 30 min  Stress: Stress Concern Present (11/16/2023)   [employer] of Occupational Health - Occupational Stress Questionnaire    Feeling of Stress : To some extent  Social Connections: Moderately Isolated (11/16/2023)   Social Connection and Isolation Panel [NHANES]    Frequency of Communication with Friends and Family: More than three times a week    Frequency of Social Gatherings with Friends and Family: More than three times a week    Attends Religious Services: 1 to 4 times per year  Active Member of Clubs or Organizations: No    Attends Banker Meetings: Not on file    Marital Status: Never married  Intimate Partner Violence: Not At Risk (12/08/2022)   Received [facility], [facility]   Humiliation, Afraid, Rape, and Kick questionnaire    Fear of Current or Ex-Partner: No    Emotionally Abused: No    Physically Abused: No    Sexually Abused: No    Outpatient Medications Prior to Visit  Medication Sig Dispense Refill   aluminum chloride (DRYSOL) 20 % external solution Apply topically at bedtime.     Clindamycin-Benzoyl Per, Refr, gel      ibuprofen (ADVIL) 200 MG tablet Take 200 mg by mouth every 6 (six) hours as needed. Takes once per week.     sodium fluoride (DENTA 5000 PLUS) 1.1 % CREA dental cream      rizatriptan (MAXALT) 10 MG  tablet TAKE 1 TABLET AT ONSET OF MIGRAINE HEADACHE MAY REPEAT ONCE IN 2 HOURS (MAX OF 2 TABLETS/24 HOURS) 9 tablet 0   No facility-administered medications prior to visit.    Allergies  Allergen Reactions   Sulfamethoxazole Hives    ROS Review of Systems  Constitutional:  Negative for chills and fever.  HENT:  Negative for congestion, ear discharge, ear pain and sore throat.   Eyes:  Negative for pain and discharge.  Respiratory:  Negative for cough and shortness of breath.   Cardiovascular:  Negative for chest pain and palpitations.  Gastrointestinal:  Negative for abdominal pain, nausea and vomiting.  Endocrine: Negative for polydipsia and polyuria.  Genitourinary:  Negative for dysuria and hematuria.  Musculoskeletal:  Negative for neck pain and neck stiffness.  Skin:  Negative for rash.  Neurological:  Negative for dizziness and weakness.  Psychiatric/Behavioral:  Negative for agitation and behavioral problems.       Objective:    Physical Exam Vitals reviewed.  Constitutional:      General: She is not in acute distress.    Appearance: She is not diaphoretic.  HENT:     Head: Normocephalic and atraumatic.     Right Ear: Ear canal and external ear normal.     Nose: Nose normal.     Mouth/Throat:     Mouth: Mucous membranes are moist.  Eyes:     General: No scleral icterus.    Extraocular Movements: Extraocular movements intact.  Cardiovascular:     Rate and Rhythm: Normal rate and regular rhythm.     Heart sounds: Normal heart sounds. No murmur heard. Pulmonary:     Breath sounds: Normal breath sounds. No wheezing or rales.  Abdominal:     Palpations: Abdomen is soft.     Tenderness: There is no abdominal tenderness.  Musculoskeletal:     Cervical back: Neck supple. No tenderness.     Right lower leg: No edema.     Left lower leg: No edema.     Comments: Tiny cyst near MCP joint of left index finger  Skin:    General: Skin is warm.     Findings: No rash.      Comments: Axillary abscess - about 3 cm in diameter, with no fluctuant area Papular rash over forehead - acne  Neurological:     General: No focal deficit present.     Mental Status: She is alert and oriented to person, place, and time.  Psychiatric:        Mood and Affect: Mood normal.  Behavior: Behavior normal.     BP 118/81   Pulse 80   Ht 5\' 5"  (1.651 m)   Wt 140 lb 9.6 oz (63.8 kg)   SpO2 100%   BMI 23.40 kg/m  Wt Readings from Last 3 Encounters:  11/17/23 140 lb 9.6 oz (63.8 kg)  01/30/23 136 lb 12.8 oz (62.1 kg)  11/03/22 137 lb 12.8 oz (62.5 kg)    Lab Results  Component Value Date   TSH 2.290 01/30/2023   Lab Results  Component Value Date   WBC 8.0 01/30/2023   HGB 13.2 01/30/2023   HCT 38.9 01/30/2023   MCV 90 01/30/2023   PLT 380 01/30/2023   Lab Results  Component Value Date   NA 140 01/30/2023   K 4.5 01/30/2023   CO2 21 01/30/2023   GLUCOSE 90 01/30/2023   BUN 9 01/30/2023   CREATININE 0.71 01/30/2023   BILITOT 0.7 01/30/2023   ALKPHOS 73 01/30/2023   AST 28 01/30/2023   ALT 36 (H) 01/30/2023   PROT 6.8 01/30/2023   ALBUMIN 4.6 01/30/2023   CALCIUM 9.5 01/30/2023   EGFR 123 01/30/2023   Lab Results  Component Value Date   CHOL 171 01/30/2023   Lab Results  Component Value Date   HDL 64 01/30/2023   Lab Results  Component Value Date   LDLCALC 94 01/30/2023   Lab Results  Component Value Date   TRIG 70 01/30/2023   Lab Results  Component Value Date   CHOLHDL 2.7 01/30/2023   No results found for: "HGBA1C"    Assessment & Plan:   Problem List Items Addressed This Visit       Cardiovascular and Mediastinum   Chronic migraine without aura without status migrainosus, not intractable   Controlled with Maxalt as needed      Relevant Medications   rizatriptan (MAXALT) 10 MG tablet   Other Relevant Orders   TSH   CMP14+EGFR   CBC with Differential/Platelet     Nervous and Auditory   Chronic otitis externa of  left ear   Followed by [facility] ENT Has not had ear discharge and erythema recently        Other   Encounter for general adult medical examination with abnormal findings - Primary   Physical exam as documented. Fasting blood tests ordered.      Mixed hyperlipidemia   Check lipid profile Advised to follow low cholesterol diet for now      Relevant Orders   Lipid panel   Axillary abscess   Started empiric doxycycline Advised to apply warm compresses If persistent, may need I&D      Relevant Medications   doxycycline (VIBRA-TABS) 100 MG tablet   Other Relevant Orders   CBC with Differential/Platelet   Other Visit Diagnoses       Vitamin D deficiency       Relevant Orders   VITAMIN D 25 Hydroxy (Vit-D Deficiency, Fractures)       Meds ordered this encounter  Medications   doxycycline (VIBRA-TABS) 100 MG tablet    Sig: Take 1 tablet (100 mg total) by mouth 2 (two) times daily.    Dispense:  14 tablet    Refill:  0   rizatriptan (MAXALT) 10 MG tablet    Sig: Take 1 tablet (10 mg total) by mouth as needed for migraine. May repeat in 2 hours if needed    Dispense:  10 tablet    Refill:  3    Follow-up: Return  in about 1 year (around 11/16/2024), or if symptoms worsen or fail to improve.    Anabel Halon, MD

## 2023-11-17 NOTE — Patient Instructions (Signed)
Please start taking Doxycycline as prescribed.  Please apply warm compresses over the area.

## 2023-12-18 ENCOUNTER — Encounter: Payer: Self-pay | Admitting: Internal Medicine

## 2023-12-23 ENCOUNTER — Encounter: Payer: Self-pay | Admitting: Internal Medicine

## 2023-12-23 ENCOUNTER — Other Ambulatory Visit: Payer: Self-pay | Admitting: Internal Medicine

## 2023-12-23 DIAGNOSIS — E559 Vitamin D deficiency, unspecified: Secondary | ICD-10-CM

## 2023-12-23 LAB — CMP14+EGFR
ALT: 42 IU/L — ABNORMAL HIGH (ref 0–32)
AST: 31 IU/L (ref 0–40)
Albumin: 4.4 g/dL (ref 4.0–5.0)
Alkaline Phosphatase: 72 IU/L (ref 44–121)
BUN/Creatinine Ratio: 16 (ref 9–23)
BUN: 12 mg/dL (ref 6–20)
Bilirubin Total: 0.4 mg/dL (ref 0.0–1.2)
CO2: 22 mmol/L (ref 20–29)
Calcium: 9.6 mg/dL (ref 8.7–10.2)
Chloride: 103 mmol/L (ref 96–106)
Creatinine, Ser: 0.77 mg/dL (ref 0.57–1.00)
Globulin, Total: 2.4 g/dL (ref 1.5–4.5)
Glucose: 96 mg/dL (ref 70–99)
Potassium: 4.4 mmol/L (ref 3.5–5.2)
Sodium: 139 mmol/L (ref 134–144)
Total Protein: 6.8 g/dL (ref 6.0–8.5)
eGFR: 111 mL/min/{1.73_m2} (ref >59)

## 2023-12-23 LAB — VITAMIN D 25 HYDROXY (VIT D DEFICIENCY, FRACTURES): Vit D, 25-Hydroxy: 11.2 ng/mL — ABNORMAL LOW (ref 30.0–100.0)

## 2023-12-23 LAB — CBC WITH DIFFERENTIAL/PLATELET
Basophils Absolute: 0.1 10*3/uL (ref 0.0–0.2)
Basos: 1 %
EOS (ABSOLUTE): 0.2 10*3/uL (ref 0.0–0.4)
Eos: 3 %
Hematocrit: 39.8 % (ref 34.0–46.6)
Hemoglobin: 13.2 g/dL (ref 11.1–15.9)
Immature Grans (Abs): 0 10*3/uL (ref 0.0–0.1)
Immature Granulocytes: 0 %
Lymphocytes Absolute: 1.8 10*3/uL (ref 0.7–3.1)
Lymphs: 31 %
MCH: 29.8 pg (ref 26.6–33.0)
MCHC: 33.2 g/dL (ref 31.5–35.7)
MCV: 90 fL (ref 79–97)
Monocytes Absolute: 0.5 10*3/uL (ref 0.1–0.9)
Monocytes: 8 %
Neutrophils Absolute: 3.4 10*3/uL (ref 1.4–7.0)
Neutrophils: 57 %
Platelets: 308 10*3/uL (ref 150–450)
RBC: 4.43 x10E6/uL (ref 3.77–5.28)
RDW: 12.5 % (ref 11.7–15.4)
WBC: 5.9 10*3/uL (ref 3.4–10.8)

## 2023-12-23 LAB — LIPID PANEL
Chol/HDL Ratio: 2.6 ratio (ref 0.0–4.4)
Cholesterol, Total: 194 mg/dL (ref 100–199)
HDL: 75 mg/dL (ref >39)
LDL Chol Calc (NIH): 109 mg/dL — ABNORMAL HIGH (ref 0–99)
Triglycerides: 53 mg/dL (ref 0–149)
VLDL Cholesterol Cal: 10 mg/dL (ref 5–40)

## 2023-12-23 LAB — TSH: TSH: 2.92 u[IU]/mL (ref 0.450–4.500)

## 2023-12-23 MED ORDER — VITAMIN D (ERGOCALCIFEROL) 1.25 MG (50000 UNIT) PO CAPS: 50000.0000 [IU] | ORAL_CAPSULE | ORAL | 1 refills | Status: DC

## 2024-01-14 ENCOUNTER — Encounter: Payer: Medicaid Other | Admitting: Internal Medicine

## 2024-04-12 ENCOUNTER — Other Ambulatory Visit: Payer: Self-pay | Admitting: Internal Medicine

## 2024-04-12 DIAGNOSIS — E559 Vitamin D deficiency, unspecified: Secondary | ICD-10-CM

## 2024-11-17 ENCOUNTER — Encounter: Payer: Medicaid Other | Admitting: Internal Medicine

## 2024-12-15 ENCOUNTER — Encounter: Payer: Self-pay | Admitting: Internal Medicine
